# Patient Record
Sex: Male | Born: 1955 | Race: Black or African American | Hispanic: No | Marital: Single | Smoking: Former smoker
Health system: Southern US, Community
[De-identification: ages and names within clinical notes are randomized; demographics above are authoritative.]

## PROBLEM LIST (undated history)

## (undated) DIAGNOSIS — C801 Malignant (primary) neoplasm, unspecified: Secondary | ICD-10-CM

## (undated) DIAGNOSIS — C61 Malignant neoplasm of prostate: Secondary | ICD-10-CM

## (undated) DIAGNOSIS — M1990 Unspecified osteoarthritis, unspecified site: Secondary | ICD-10-CM

## (undated) DIAGNOSIS — N529 Male erectile dysfunction, unspecified: Secondary | ICD-10-CM

## (undated) DIAGNOSIS — H269 Unspecified cataract: Secondary | ICD-10-CM

## (undated) HISTORY — PX: PROSTATE SURGERY: SHX751

## (undated) HISTORY — DX: Unspecified cataract: H26.9

## (undated) HISTORY — DX: Male erectile dysfunction, unspecified: N52.9

## (undated) HISTORY — DX: Unspecified osteoarthritis, unspecified site: M19.90

---
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
## 1977-11-19 HISTORY — PX: OTHER SURGICAL HISTORY: SHX169

## 2006-11-19 HISTORY — PX: COLONOSCOPY: SHX174

## 2007-03-06 ENCOUNTER — Emergency Department (HOSPITAL_COMMUNITY): Admission: EM | Admit: 2007-03-06 | Discharge: 2007-03-06 | Payer: Self-pay | Admitting: Family Medicine

## 2007-09-03 ENCOUNTER — Ambulatory Visit: Payer: Self-pay | Admitting: Gastroenterology

## 2007-09-17 ENCOUNTER — Ambulatory Visit: Payer: Self-pay | Admitting: Gastroenterology

## 2007-11-06 ENCOUNTER — Emergency Department (HOSPITAL_COMMUNITY): Admission: EM | Admit: 2007-11-06 | Discharge: 2007-11-06 | Payer: Self-pay | Admitting: Emergency Medicine

## 2007-11-10 ENCOUNTER — Ambulatory Visit (HOSPITAL_COMMUNITY): Admission: RE | Admit: 2007-11-10 | Discharge: 2007-11-10 | Payer: Self-pay | Admitting: Urology

## 2008-11-19 DIAGNOSIS — C61 Malignant neoplasm of prostate: Secondary | ICD-10-CM

## 2008-11-19 HISTORY — DX: Malignant neoplasm of prostate: C61

## 2009-03-17 ENCOUNTER — Encounter (INDEPENDENT_AMBULATORY_CARE_PROVIDER_SITE_OTHER): Payer: Self-pay | Admitting: Urology

## 2009-03-17 ENCOUNTER — Inpatient Hospital Stay (HOSPITAL_COMMUNITY): Admission: RE | Admit: 2009-03-17 | Discharge: 2009-03-18 | Payer: Self-pay | Admitting: Urology

## 2009-12-09 ENCOUNTER — Ambulatory Visit: Payer: Self-pay | Admitting: Internal Medicine

## 2009-12-09 ENCOUNTER — Encounter (INDEPENDENT_AMBULATORY_CARE_PROVIDER_SITE_OTHER): Payer: Self-pay | Admitting: Family Medicine

## 2009-12-09 LAB — CONVERTED CEMR LAB
Albumin: 4.7 g/dL (ref 3.5–5.2)
Alkaline Phosphatase: 94 units/L (ref 39–117)
Basophils Relative: 0 % (ref 0–1)
CO2: 25 meq/L (ref 19–32)
Chloride: 106 meq/L (ref 96–112)
Cholesterol: 194 mg/dL (ref 0–200)
Creatinine, Ser: 1.01 mg/dL (ref 0.40–1.50)
Glucose, Bld: 89 mg/dL (ref 70–99)
HCT: 42 % (ref 39.0–52.0)
HDL: 61 mg/dL (ref >39)
Hemoglobin: 14.2 g/dL (ref 13.0–17.0)
Lymphs Abs: 2.6 10*3/uL (ref 0.7–4.0)
MCV: 95 fL (ref 78.0–100.0)
Monocytes Absolute: 0.5 10*3/uL (ref 0.1–1.0)
Monocytes Relative: 8 % (ref 3–12)
Neutro Abs: 3.7 10*3/uL (ref 1.7–7.7)
RDW: 13.1 % (ref 11.5–15.5)
Total CHOL/HDL Ratio: 3.2

## 2010-02-23 ENCOUNTER — Ambulatory Visit: Payer: Self-pay | Admitting: Internal Medicine

## 2010-04-26 ENCOUNTER — Ambulatory Visit: Payer: Self-pay | Admitting: Internal Medicine

## 2010-06-14 ENCOUNTER — Ambulatory Visit: Payer: Self-pay | Admitting: Internal Medicine

## 2010-12-10 ENCOUNTER — Encounter: Payer: Self-pay | Admitting: Urology

## 2011-02-28 LAB — BASIC METABOLIC PANEL
BUN: 8 mg/dL (ref 6–23)
CO2: 28 mEq/L (ref 19–32)
Calcium: 9.3 mg/dL (ref 8.4–10.5)
GFR calc non Af Amer: >60 mL/min (ref >60)
Glucose, Bld: 107 mg/dL — ABNORMAL HIGH (ref 70–99)
Potassium: 4.3 mEq/L (ref 3.5–5.1)

## 2011-02-28 LAB — CBC
HCT: 39.7 % (ref 39.0–52.0)
RBC: 4.01 MIL/uL — ABNORMAL LOW (ref 4.22–5.81)
WBC: 4.7 10*3/uL (ref 4.0–10.5)

## 2011-02-28 LAB — HEMOGLOBIN AND HEMATOCRIT, BLOOD
HCT: 36.5 % — ABNORMAL LOW (ref 39.0–52.0)
Hemoglobin: 12.4 g/dL — ABNORMAL LOW (ref 13.0–17.0)

## 2011-02-28 LAB — TYPE AND SCREEN: Antibody Screen: NEGATIVE

## 2011-04-03 NOTE — Op Note (Signed)
Joshua Velazquez, Joshua Velazquez NO.:  [id number]   MEDICAL RECORD NO.:  [id number]          PATIENT TYPE:  INP   LOCATION:  0006                         FACILITY:  [facility]   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   PROCEDURE:  Robotic-assisted laparoscopic radical prostatectomy  (bilateral nerve sparing).   SURGEON:  Heloise Purpura, M.D.   FIRST ASSISTANT:  Delia Chimes, Nurse Practitioner.   SECOND ASSISTANT:  Georgeanna Lea, M.D.   ANESTHESIA:  General.   COMPLICATIONS:  None.   ESTIMATED BLOOD LOSS:  100 mL.   INTRAVENOUS FLUIDS:  1300 mL of lactated Ringer's.   SPECIMEN:  Prostate and seminal vesicles.   DISPOSITION:  Specimen to pathology.   DRAINS:  1. A 20-French coude' catheter.  2. A #19 Blake pelvic drain.   INDICATIONS FOR PROCEDURE:  Joshua Velazquez is a 55 year old gentleman with  clinically-localized adenocarcinoma of the prostate.  After a discussion  regarding the management options for treatment, he elected to proceed  with surgical therapy and the above procedure.  The potential risks,  complications and alternative treatment options were discussed in  detail, and an informed consent was obtained.   DESCRIPTION OF PROCEDURE:  The patient was taken to the operating room  and a general anesthetic was administered.  He was given preoperative  antibiotics, placed in the dorsal lithotomy position and prepped and  draped in the usual sterile fashion.  Next, a preoperative time-out was  performed.  A Foley catheter was then inserted into the bladder and a  site was selected just to the left of the umbilicus for  placement of the  camera port.  This was placed using a standard open Hassan technique  which allowed entry into the peritoneal cavity under direct vision  without difficulty.  A 12 mm port was then placed and a pneumoperitoneum  established.  With the 0-degree lens, the abdomen was inspected and  there was no evidence for any intra-abdominal injuries.  There were  noted to be adhesions in the right upper quadrant consistent with the  patient's prior exploratory surgery, related to a past trauma event;  however, these adhesions did not appear to be in the way of the expected  port placements.  The 8 mm robotic ports were placed in the left lower  quadrant and right lower quadrant.  A 5 mm port was placed between the  camera port and the right robotic port and a 12 mm port was placed in  the far right lateral abdominal wall for laparoscopic assistance.  All  ports were placed under direct vision without difficulty.  The surgical  cart was then docked.  With the aid of the cautery scissors, the bladder  was reflected posteriorly, allowing entry into space of Retzius  and  identification of the endopelvic fascia and prostate.  The endopelvic  fascia was then incised, from the apex back to the base of the prostate  bilaterally and the underlying levator muscle fibers were swept  laterally off of the prostate.  This isolated the dorsal venous complex  which was then stapled and divided with a 45 mm flex ETS stapler.   The attention was then turned to the bladder neck which was divided  anteriorly.  This exposed the Foley catheter and the catheter balloon  was deflated.  The catheter was brought into the operative field and  used to retract the prostate anteriorly.  This exposed the posterior  bladder neck which was then divided and dissection proceeded between the  prostate and bladder until the vasa deferens and seminal vesicles were  identified.  The vasa deferens were isolated, divided and  lifted  anteriorly.  The seminal vesicles were then dissected down to their tips  with the use of Hemolock clips to control seminal vesicle arterial blood  supply.  The seminal vesicles were then lifted anteriorly and the space  between Denonvilliers' fascia and the anterior rectum was bluntly  developed, thereby isolating the vascular pedicles of the prostate.  The  lateral prostatic fascia was then incised bilaterally, allowing the  neurovascular bundles to be released.  The vascular pedicles of the  prostate were then ligated with Hemolock clips above the level of the  neurovascular bundles and divided with sharp cold scissor dissection.  The neurovascular bundles were then swept off the apex of the prostate  and urethra.  The ureter was then sharply transected, allowing the  prostate specimen to be disarticulated.  The pelvis was copiously  irrigated and hemostasis was ensured.  There was noted to be a small  area of venous bleeding from the right vascular pedicle, which was  controlled with an additional Hemolock clip.   Attention then turned to the urethral anastomosis.  A 2-0 Vicryl slip-  knot was placed between Denonvilliers' fascia, the posterior bladder  neck and the posterior urethra, to reapproximate these structures.  A  double-armed 3-0 Monocryl suture was then used to perform a 360-degree  running tension-free anastomosis between the bladder neck and urethra.  A new 20-French coude' catheter was inserted into the bladder and  irrigated.  There were no blood clots within the bladder, and the  anastomosis appeared to be water-tight.  A #19 Blake drain was then  brought through the left robotic port and appropriately positioned  within the pelvis.  It was secured to the skin with a nylon suture.  The  surgical cart was then undocked.  The right lateral 12 mm port site was  closed with 0-Vicryl suture, placed with the aid of the suture passer  device.  All remaining ports  were removed under direct vision.  The  prostate specimen was removed intact within the Endopouch retrieval bag.  This fascial opening was then closed with a running 0-Vicryl suture.  All port sites were injected with 0.25% Marcaine and reapproximated at  the skin level with staples.  Sterile dressings were applied.   The patient appeared to tolerate procedure well without complications.  He was able to be extubated and transferred to the recovery unit in  satisfactory condition  .     Heloise Purpura, MD  Electronically Signed    LB/MEDQ  D:  03/17/2009  T:  03/17/2009  Job:  [id number]

## 2011-05-19 ENCOUNTER — Emergency Department (HOSPITAL_COMMUNITY)
Admission: EM | Admit: 2011-05-19 | Discharge: 2011-05-19 | Disposition: A | Discharge status: Home / Self Care | Payer: Self-pay | Attending: Emergency Medicine | Admitting: Emergency Medicine

## 2011-05-19 DIAGNOSIS — Z8546 Personal history of malignant neoplasm of prostate: Secondary | ICD-10-CM | POA: Insufficient documentation

## 2011-05-19 DIAGNOSIS — K6289 Other specified diseases of anus and rectum: Secondary | ICD-10-CM | POA: Insufficient documentation

## 2011-05-19 DIAGNOSIS — R197 Diarrhea, unspecified: Secondary | ICD-10-CM | POA: Insufficient documentation

## 2011-08-24 LAB — I-STAT 8, (EC8 V) (CONVERTED LAB)
Bicarbonate: 26.9 — ABNORMAL HIGH
Glucose, Bld: 120 — ABNORMAL HIGH
HCT: 47
Hemoglobin: 16
Operator id: 247071
pCO2, Ven: 35.6 — ABNORMAL LOW
pH, Ven: 7.486 — ABNORMAL HIGH

## 2011-08-24 LAB — DIFFERENTIAL
Basophils Absolute: 0
Eosinophils Relative: 1
Neutro Abs: 4.5

## 2011-08-24 LAB — CBC
RBC: 4.33
RDW: 14

## 2011-08-24 LAB — POCT I-STAT CREATININE: Creatinine, Ser: 1.1

## 2012-03-07 ENCOUNTER — Emergency Department (HOSPITAL_COMMUNITY): Payer: Self-pay

## 2012-03-07 ENCOUNTER — Encounter (HOSPITAL_COMMUNITY): Payer: Self-pay | Admitting: General Practice

## 2012-03-07 ENCOUNTER — Emergency Department (HOSPITAL_COMMUNITY)
Admission: EM | Admit: 2012-03-07 | Discharge: 2012-03-07 | Disposition: A | Discharge status: Home / Self Care | Payer: Self-pay | Attending: Emergency Medicine | Admitting: Emergency Medicine

## 2012-03-07 DIAGNOSIS — M25579 Pain in unspecified ankle and joints of unspecified foot: Secondary | ICD-10-CM | POA: Insufficient documentation

## 2012-03-07 DIAGNOSIS — M79606 Pain in leg, unspecified: Secondary | ICD-10-CM

## 2012-03-07 DIAGNOSIS — F17200 Nicotine dependence, unspecified, uncomplicated: Secondary | ICD-10-CM | POA: Insufficient documentation

## 2012-03-07 DIAGNOSIS — M79609 Pain in unspecified limb: Secondary | ICD-10-CM | POA: Insufficient documentation

## 2012-03-07 HISTORY — DX: Malignant (primary) neoplasm, unspecified: C80.1

## 2012-03-07 MED ORDER — NAPROXEN 250 MG PO TABS: 250.0000 mg | ORAL_TABLET | Freq: Two times a day (BID) | ORAL | Status: AC

## 2012-03-07 NOTE — Discharge Instructions (Signed)
RESOURCE GUIDE  Dental Problems  Patients with Medicaid: [facility]                     [facility] [address]                                           [address] Phone:  [phone]                                                  Phone:  [phone]  If unable to pay or uninsured, contact:  [facility] or [facility] to become qualified for the adult dental clinic.  Chronic Pain Problems Contact [facility]  [phone] Patients need to be referred by their primary care doctor.  Insufficient Money for Medicine Contact [employer]:  call "211" or [facility] 271-5999.  No Primary Care Doctor Call [facility]  832-8000 Other agencies that provide inexpensive medical care    [facility]  [phone]    [facility]  [phone]    [facility]  [phone]    Women's Clinic  [phone]    [facility]  [phone]    [facility]  [phone]  Psychological Services [facility]  [phone] [facility]  [phone] [facility]   [phone] (emergency services 641-4993)  Substance Abuse Resources [facility]  336-882-2125 [facility] 336-784-9470 [facility] 336-285-9073 [facility] 336-845-3988 Residential & Outpatient Substance Abuse Program  800-659-3381  Abuse/Neglect [place] Child Abuse Hotline (336) 641-3795 [place] Child Abuse Hotline 800-378-5315 (After Hours)  Emergency Shelter [facility] (336) 271-5985  Maternity Homes [facility] (336) 275-9566 [facility] (704) 372-4663  MRSA Hotline #:   832-7006    [place] Resources  [facility] [place]     [employer]                          [facility] [address] [place]                       [address]      [address]  [place]                                                [place]                            [place] Phone:  [phone]                                   Phone:  [phone]                 Phone:  [phone]  [facility] Phone:  [phone]    [facility] Child Abuse Hotline 940 684 7062 470-062-1113 (After Hours)   Take the prescription as directed.  Apply moist heat or ice to the area(s) of discomfort, for 15 minutes at a time, several times per day for the next few days.  Do not fall asleep on a heating or ice pack.  Call your regular medical doctor and the Orthopedic doctor today to schedule a follow up appointment within the next week.  Return to the Emergency Department immediately if worsening.

## 2012-03-07 NOTE — ED Provider Notes (Signed)
History     CSN: [id number]  Arrival date & time 03/07/12  1111   First MD Initiated Contact with Patient 03/07/12 1144      Chief Complaint  Patient presents with  . Leg Pain    HPI Pt was seen at 1150.  Per pt, c/o gradual onset and persistence of constant left anterior tibial and ankle "pain" that began approx 1 year ago.  Pt states his symptoms started after he "sprained my ankle."  Pt states he has been eval by his PMD and Ortho MD for same but does not recall the dx.  Pt states the pain worsens after activity such as running and palpation of the area.  Denies foot pain, no knee pain, no calf pain/swelling, no rash, no fevers, no focal motor weakness, no tingling/numbness in extremity.    Past Medical History  Diagnosis Date  . Cancer     Prostate    Past Surgical History  Procedure Date  . Prostate surgery     Removal of prostate    History  Substance Use Topics  . Smoking status: Current Everyday Smoker -- 0.5 packs/day    Types: Cigarettes  . Smokeless tobacco: Never Used  . Alcohol Use: No    Review of Systems ROS: Statement: All systems negative except as marked or noted in the HPI; Constitutional: Negative for fever and chills. ; ; Eyes: Negative for eye pain, redness and discharge. ; ; ENMT: Negative for ear pain, hoarseness, nasal congestion, sinus pressure and sore throat. ; ; Cardiovascular: Negative for chest pain, palpitations, diaphoresis, dyspnea and peripheral edema. ; ; Respiratory: Negative for cough, wheezing and stridor. ; ; Gastrointestinal: Negative for nausea, vomiting, diarrhea, abdominal pain, blood in stool, hematemesis, jaundice and rectal bleeding. . ; ; Genitourinary: Negative for dysuria, flank pain and hematuria. ; ; Musculoskeletal: +left tibia pain.  Negative for back pain and neck pain. Negative for swelling and trauma.; ; Skin: Negative for pruritus, rash, abrasions, blisters, bruising and skin lesion.; ; Neuro: Negative for headache,  lightheadedness and neck stiffness. Negative for weakness, altered level of consciousness , altered mental status, extremity weakness, paresthesias, involuntary movement, seizure and syncope.     Allergies  Review of patient's allergies indicates no known allergies.  Home Medications   Current Outpatient Rx  Name Route Sig Dispense Refill  . IBUPROFEN 800 MG PO TABS Oral Take 800 mg by mouth every 8 (eight) hours as needed. For pain.      BP 132/85  Pulse 88  Temp(Src) 98 F (36.7 C) (Oral)  Resp 18  Physical Exam 1155: Physical examination:  Nursing notes reviewed; Vital signs and O2 SAT reviewed;  Constitutional: Well developed, Well nourished, Well hydrated, In no acute distress; Head:  Normocephalic, atraumatic; Eyes: EOMI, PERRL, No scleral icterus; ENMT: Mouth and pharynx normal, Mucous membranes moist; Neck: Supple, Full range of motion, No lymphadenopathy; Cardiovascular: Regular rate and rhythm, No murmur, rub, or gallop; Respiratory: Breath sounds clear & equal bilaterally, No rales, rhonchi, wheezes, or rub, Normal respiratory effort/excursion; Chest: Nontender, Movement normal; Abdomen: Soft, Nontender, Nondistended, Normal bowel sounds; Genitourinary: No CVA tenderness; Extremities: Pulses normal, No tenderness, NT left ankle, no edema, no ecchymosis, no erythema, no open wounds, no deformity. NMS intact left foot, strong pedal pp, LLE muscle compartments soft.  No left proximal fibular head tenderness, no left knee tenderness, no left foot tenderness.  +plantarflexion of left foot w/calf squeeze.  No palpable gap left Achilles's tendon. NT left knee with  FROM, including able to lift extended LLE off stretcher, and extend left lower leg against resistance.  No ligamentous laxity.  No patellar or quad tendon step-offs.  No edema, erythema, warmth, deformity.  Mild tenderness to left mid-anterior tibia bone area without open wound, no erythema, no ecchymosis, no edema, no deformity.   Left calf NT.  No edema, No calf edema or asymmetry.; Neuro: AA&Ox3, Major CN grossly intact.  No gross focal motor or sensory deficits in extremities.; Skin: Color normal, Warm, Dry, no rash.    ED Course  Procedures    MDM  MDM Reviewed: nursing note and vitals Interpretation: x-ray   Dg Tibia/fibula Left 03/07/2012  *RADIOLOGY REPORT*  Clinical Data: Leg pain  LEFT TIBIA AND FIBULA - 2 VIEW  Comparison: None  Findings: Four views of the left tibia-fibula submitted.  No acute fracture or subluxation.  No radiopaque foreign body.  IMPRESSION: No acute fracture or subluxation.  Original Report Authenticated By: Natasha Mead, M.D.   Dg Ankle Complete Left 03/07/2012  *RADIOLOGY REPORT*  Clinical Data: Pain  LEFT ANKLE COMPLETE - 3+ VIEW  Comparison: None.  Findings: Three views of the left ankle submitted.  No acute fracture or subluxation.  Ankle mortise is preserved.  IMPRESSION: No acute fracture or subluxation.  Original Report Authenticated By: Natasha Mead, M.D.      12:47 PM:   No acute bony injury on XR.  Doubt DVT as calf is NT.  Likely "shin splints" as pt c/o pain after activity, esp running.  Doubt arterial insuff given strong palp pedal pulses.  Dx testing d/w pt and family.  Questions answered.  Verb understanding, agreeable to d/c home with outpt f/u with Ortho MD.       Laray Anger, DO 03/10/12 9604

## 2012-03-07 NOTE — ED Notes (Signed)
Pt d/c home in NAD. Pt voiced understanding of d/c instructions and follow up care.  

## 2012-03-07 NOTE — ED Notes (Signed)
Pt states "I sprained my whole leg a year ago and still having pain and swelling." Pain is in the left shin. No swelling noted to area.

## 2012-03-07 NOTE — ED Notes (Signed)
Pt states he sprained his ankle approximately 1 year ago and has had recurrent left leg pain since that time . His pain is localized to left anterior shin with radiation to ankle, increased on palpation and weight bearing.

## 2012-05-06 IMAGING — CR DG TIBIA/FIBULA 2V*L*
4 series · 4 of 4 positions shown · non-contrast
Comparison: None

CLINICAL DATA: Leg pain

LEFT TIBIA AND FIBULA - 2 VIEW

[t tib/fib ap left (1 of 2)]
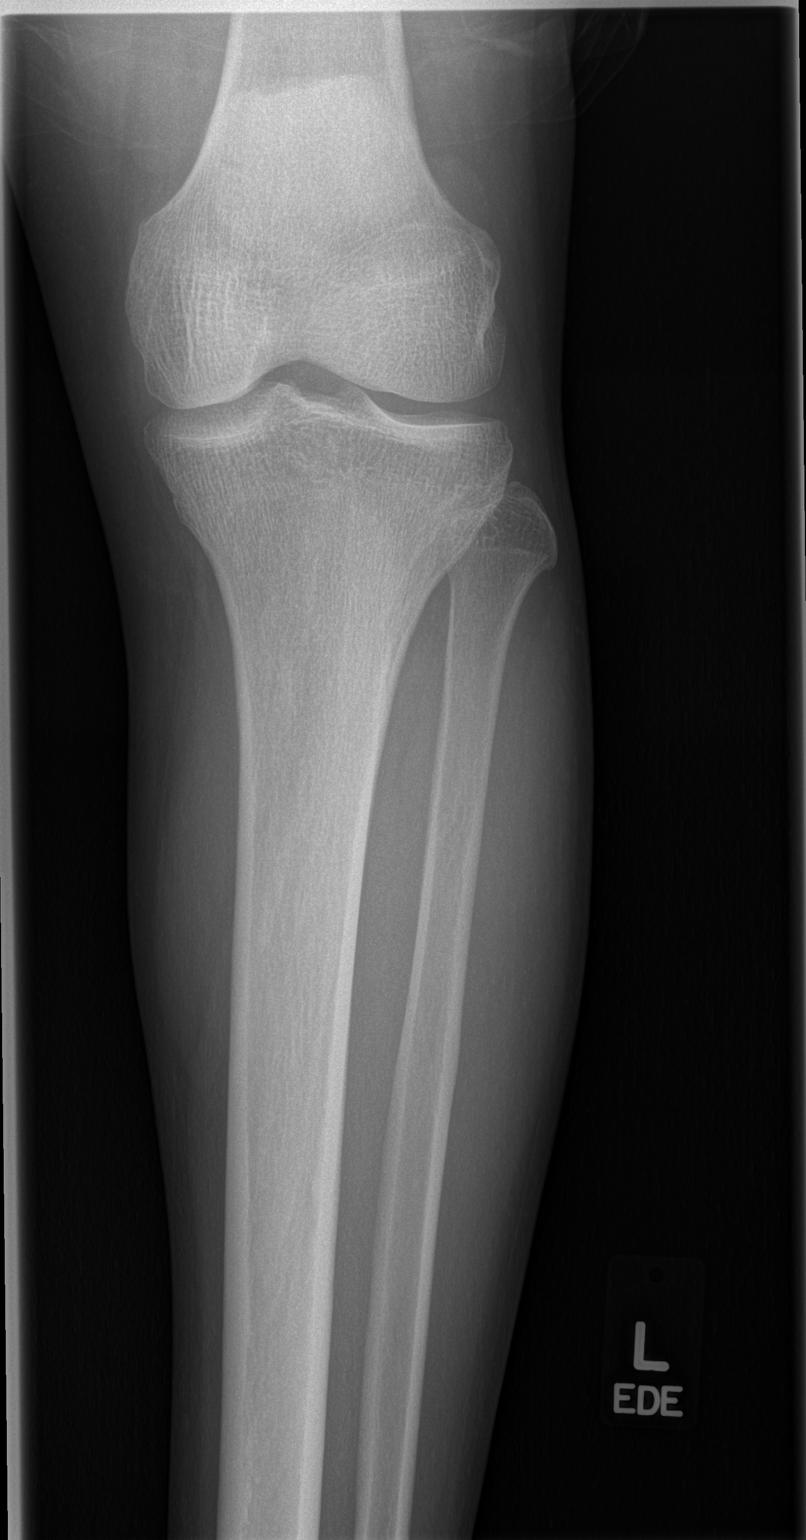

[t tib/fib ap left (2 of 2)]
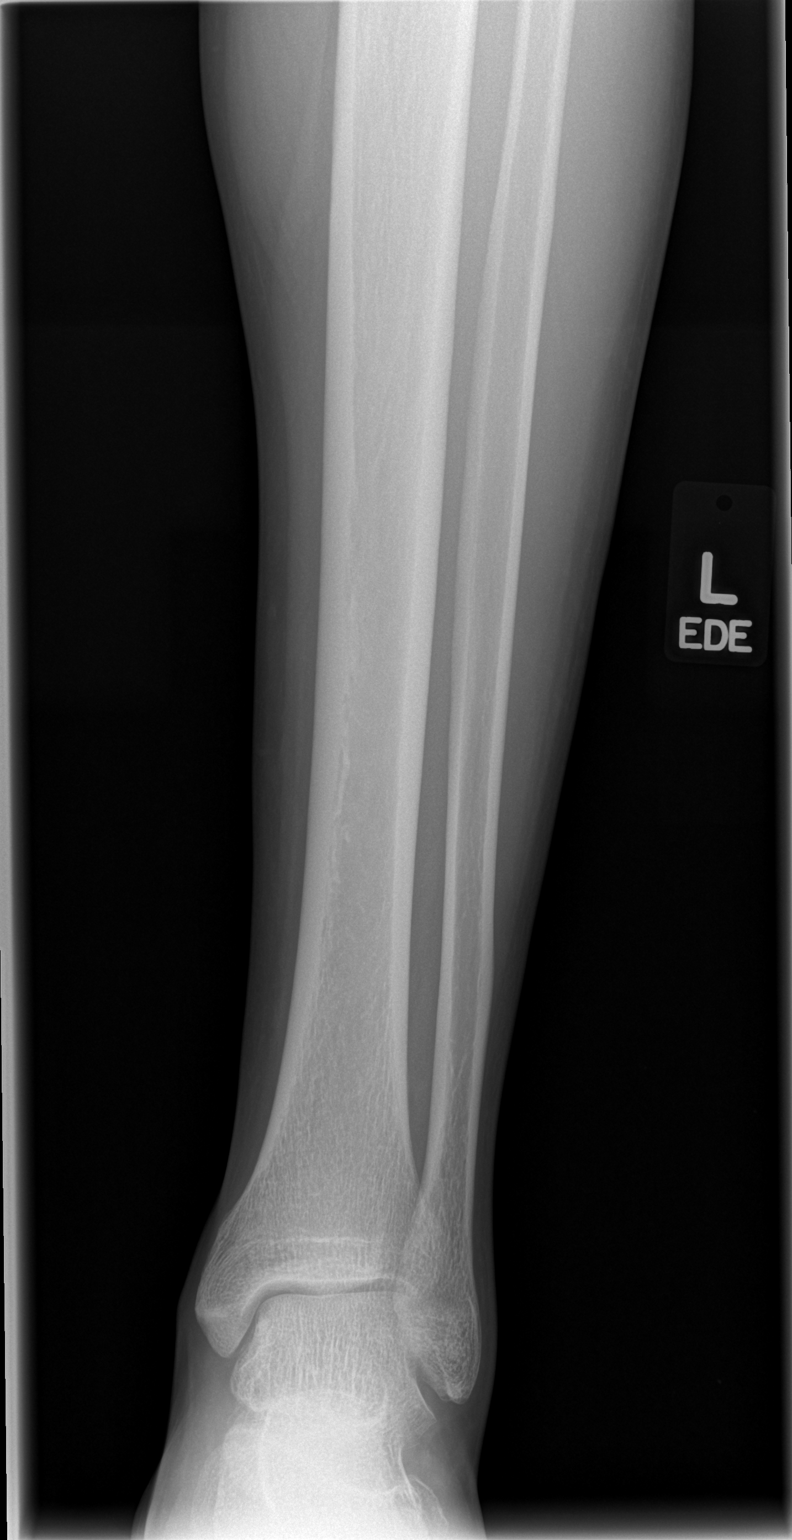

[t tib/fib lat left (1 of 2)]
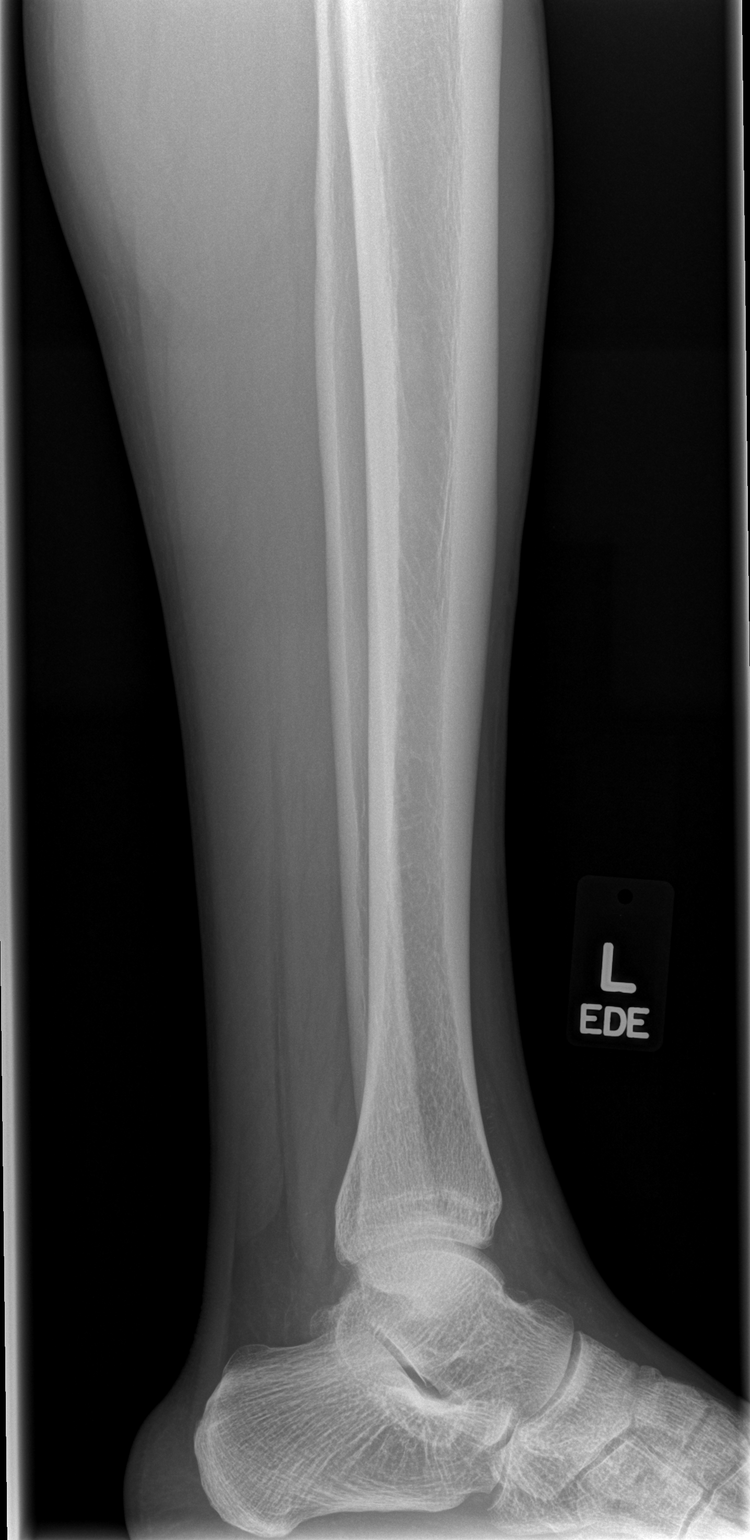

[t tib/fib lat left (2 of 2)]
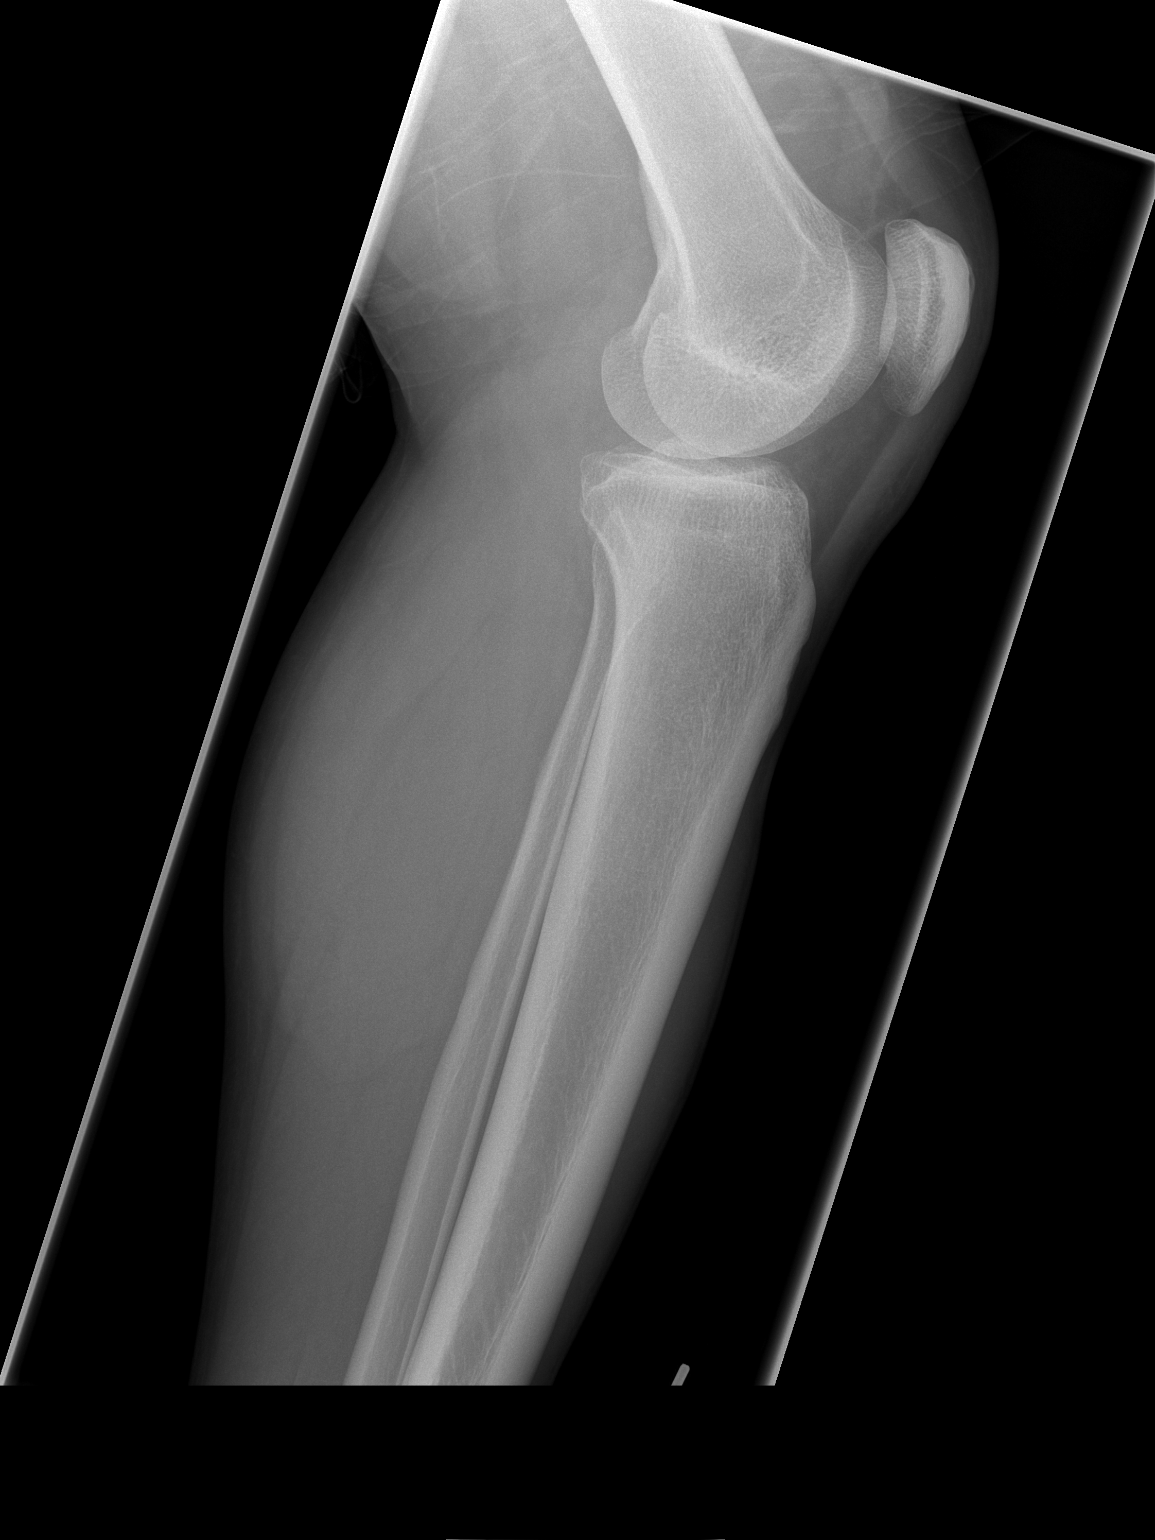

[4 of 4 positions shown; findings below may reference images not displayed]

FINDINGS: Four views of the left tibia-fibula submitted.  No acute
fracture or subluxation.  No radiopaque foreign body.
IMPRESSION: No acute fracture or subluxation.

## 2012-07-30 ENCOUNTER — Encounter: Payer: Self-pay | Admitting: Family Medicine

## 2013-12-13 ENCOUNTER — Encounter (HOSPITAL_COMMUNITY): Payer: Self-pay | Admitting: Emergency Medicine

## 2013-12-13 ENCOUNTER — Emergency Department (HOSPITAL_COMMUNITY)
Admission: EM | Admit: 2013-12-13 | Discharge: 2013-12-13 | Disposition: A | Discharge status: Home / Self Care | Payer: Self-pay | Attending: Emergency Medicine | Admitting: Emergency Medicine

## 2013-12-13 DIAGNOSIS — R5381 Other malaise: Secondary | ICD-10-CM | POA: Insufficient documentation

## 2013-12-13 DIAGNOSIS — R5383 Other fatigue: Secondary | ICD-10-CM

## 2013-12-13 DIAGNOSIS — R51 Headache: Secondary | ICD-10-CM | POA: Insufficient documentation

## 2013-12-13 DIAGNOSIS — J029 Acute pharyngitis, unspecified: Secondary | ICD-10-CM | POA: Insufficient documentation

## 2013-12-13 DIAGNOSIS — J209 Acute bronchitis, unspecified: Secondary | ICD-10-CM | POA: Insufficient documentation

## 2013-12-13 DIAGNOSIS — Z8546 Personal history of malignant neoplasm of prostate: Secondary | ICD-10-CM | POA: Insufficient documentation

## 2013-12-13 DIAGNOSIS — R52 Pain, unspecified: Secondary | ICD-10-CM | POA: Insufficient documentation

## 2013-12-13 DIAGNOSIS — J40 Bronchitis, not specified as acute or chronic: Secondary | ICD-10-CM

## 2013-12-13 DIAGNOSIS — IMO0001 Reserved for inherently not codable concepts without codable children: Secondary | ICD-10-CM | POA: Insufficient documentation

## 2013-12-13 DIAGNOSIS — F17200 Nicotine dependence, unspecified, uncomplicated: Secondary | ICD-10-CM | POA: Insufficient documentation

## 2013-12-13 MED ORDER — ALBUTEROL SULFATE HFA 108 (90 BASE) MCG/ACT IN AERS: 2.0000 | INHALATION_SPRAY | RESPIRATORY_TRACT | Status: DC | PRN

## 2013-12-13 MED ORDER — HYDROCOD POLST-CHLORPHEN POLST 10-8 MG/5ML PO LQCR: 5.0000 mL | Freq: Two times a day (BID) | ORAL | Status: DC | PRN

## 2013-12-13 MED ORDER — AZITHROMYCIN 250 MG PO TABS: 250.0000 mg | ORAL_TABLET | Freq: Every day | ORAL | Status: DC

## 2013-12-13 MED ORDER — ALBUTEROL SULFATE HFA 108 (90 BASE) MCG/ACT IN AERS
2.0000 | INHALATION_SPRAY | Freq: Once | RESPIRATORY_TRACT | Status: AC
  Administered 2013-12-13: 2 via RESPIRATORY_TRACT
  Filled 2013-12-13: qty 6.7

## 2013-12-13 NOTE — ED Provider Notes (Signed)
Medical screening examination/treatment/procedure(s) were performed by non-physician practitioner and as supervising physician I was immediately available for consultation/collaboration.  EKG Interpretation   None        Threasa Beards, MD 12/13/13 2112

## 2013-12-13 NOTE — ED Provider Notes (Signed)
CSN: [id number]     Arrival date & time 12/13/13  1932 History  This chart was scribed for non-physician practitioner, Ignacia Felling, PA-C,working with Threasa Beards, MD, by Marlowe Kays, ED Scribe.  This patient was seen in room TR06C/TR06C and the patient's care was started at 8:43 PM.  Chief Complaint  Patient presents with  . Cough   The history is provided by the patient. No language interpreter was used.   HPI Comments:  Deagen Krass is a 58 y.o. male who presents to the Emergency Department complaining of worsening productive cough of green phlegm, body aches, and headache that started approximately two weeks ago. Pt reports associated chills, subjective fever, and sore throat. Pt reports taking OTC medications with no relief. Pt denies sick contacts. Pt denies otalgia. Pt reports being a smoker. Pt states he has never had a flu vaccination.    Past Medical History  Diagnosis Date  . Cancer     Prostate   Past Surgical History  Procedure Laterality Date  . Prostate surgery      Removal of prostate   No family history on file. History  Substance Use Topics  . Smoking status: Current Every Day Smoker -- 0.50 packs/day    Types: Cigarettes  . Smokeless tobacco: Never Used  . Alcohol Use: No    Review of Systems  Constitutional: Positive for chills. Negative for fever.       Generalized body aches and fatigue  HENT: Positive for sore throat. Negative for ear pain.   Respiratory: Positive for cough.   Gastrointestinal: Negative for nausea, vomiting and diarrhea.  Neurological: Positive for headaches.  All other systems reviewed and are negative.    Allergies  Review of patient's allergies indicates no known allergies.  Home Medications   Current Outpatient Rx  Name  Route  Sig  Dispense  Refill  . ibuprofen (ADVIL,MOTRIN) 800 MG tablet   Oral   Take 800 mg by mouth every 8 (eight) hours as needed. For pain.          Triage Vitals: BP 131/80  Pulse  100  Temp(Src) 98.5 F (36.9 C) (Oral)  Resp 20  Wt 194 lb 3 oz (88.083 kg)  SpO2 95% Physical Exam  Nursing note and vitals reviewed. Constitutional: He is oriented to person, place, and time. He appears well-developed and well-nourished. No distress.  HENT:  Head: Normocephalic and atraumatic.  Right Ear: External ear normal.  Left Ear: External ear normal.  Mouth/Throat: Oropharynx is clear and moist. No oropharyngeal exudate.  Clear rhinorrhea  Eyes: Conjunctivae are normal. Pupils are equal, round, and reactive to light. No scleral icterus.  Neck: Normal range of motion. Neck supple.  Cardiovascular: Normal rate, regular rhythm and normal heart sounds.  Exam reveals no gallop and no friction rub.   No murmur heard. Pulmonary/Chest: Effort normal. No respiratory distress. He has wheezes. He has no rales. He exhibits no tenderness.  Bilateral expiratory wheezing  Abdominal: Soft. Bowel sounds are normal. He exhibits no distension. There is no tenderness. There is no rebound.  Musculoskeletal: Normal range of motion. He exhibits no edema and no tenderness.  Lymphadenopathy:    He has no cervical adenopathy.  Neurological: He is alert and oriented to person, place, and time. He exhibits normal muscle tone. Coordination normal.  Skin: Skin is warm and dry. No rash noted. No erythema. No pallor.  Psychiatric: He has a normal mood and affect. His behavior is normal. Judgment and thought  content normal.    ED Course  Procedures (including critical care time) DIAGNOSTIC STUDIES: Oxygen Saturation is 95% on RA, adequate by my interpretation.   COORDINATION OF CARE: 8:50PM- Will prescribe antibiotic, albuterol MDI and cough medication. Pt verbalizes understanding and agrees to plan.  Medications - No data to display  Labs Review Labs Reviewed - No data to display Imaging Review No results found.  EKG Interpretation   None       MDM  Bronchitis  Patient here with cough,  nasal congestion, wheezing, headache, body aches.  Afebrile here so I doubt influenza, patient is smoker so I think this is more bronchitis than anything.  Will start on antibiotics, albuterol inhaler here, and cough medication.  I personally performed the services described in this documentation, which was scribed in my presence. The recorded information has been reviewed and is accurate.    Idalia Needle Joelyn Oms, [place] 12/13/13 2056

## 2013-12-13 NOTE — Discharge Instructions (Signed)

## 2013-12-13 NOTE — ED Notes (Signed)
C/o productive cough with green phlegm, body aches, headache, chills, and fatigue intermittently x 2 weeks.  Taking OTC meds without relief.  Denies nausea, vomiting, and diarrhea.

## 2015-02-22 ENCOUNTER — Emergency Department (INDEPENDENT_AMBULATORY_CARE_PROVIDER_SITE_OTHER)
Admission: EM | Admit: 2015-02-22 | Discharge: 2015-02-22 | Disposition: A | Discharge status: Home / Self Care | Payer: Self-pay | Source: Home / Self Care | Attending: Family Medicine | Admitting: Family Medicine

## 2015-02-22 ENCOUNTER — Encounter (HOSPITAL_COMMUNITY): Payer: Self-pay | Admitting: Emergency Medicine

## 2015-02-22 DIAGNOSIS — K5909 Other constipation: Secondary | ICD-10-CM

## 2015-02-22 DIAGNOSIS — K594 Anal spasm: Secondary | ICD-10-CM

## 2015-02-22 MED ORDER — GLYCERIN (LAXATIVE) 2 G RE SUPP: 1.0000 | Freq: Once | RECTAL | Status: DC

## 2015-02-22 MED ORDER — DILTIAZEM GEL 2 %: 1.0000 "application " | Freq: Two times a day (BID) | CUTANEOUS | Status: DC

## 2015-02-22 NOTE — ED Provider Notes (Signed)
CSN: [id number]     Arrival date & time 02/22/15  1133 History   First MD Initiated Contact with Patient 02/22/15 1321     Chief Complaint  Patient presents with  . Rectal Bleeding    no bleeding.  having anal pain.    (Consider location/radiation/quality/duration/timing/severity/associated sxs/prior Treatment) HPI  Yesterday evening developed severe rectal pain. Started after BM. Difficult to pass. Epsom salt soak and suppositories w/o benefit. Worse w/ sitting. Denies diarrhea, fevers, abd pain, dysuria, back pain, HA, CP, SOB, BRBPR, melena. No anal sex. Tylenol w/o improvement.    Past Medical History  Diagnosis Date  . Cancer     Prostate   Past Surgical History  Procedure Laterality Date  . Prostate surgery      Removal of prostate   Family History  Problem Relation Age of Onset  . Cancer Neg Hx    History  Substance Use Topics  . Smoking status: Current Every Day Smoker -- 0.50 packs/day    Types: Cigarettes  . Smokeless tobacco: Never Used  . Alcohol Use: No    Review of Systems Per HPI with all other pertinent systems negative.   Allergies  Review of patient's allergies indicates no known allergies.  Home Medications   Prior to Admission medications   Medication Sig Start Date End Date Taking? Authorizing Provider  diltiazem 2 % GEL Apply 1 application topically 2 (two) times daily. 02/22/15   Waldemar Dickens, MD  glycerin adult (GLYCERIN ADULT) 2 G SUPP Place 1 suppository rectally once. 02/22/15   Waldemar Dickens, MD   BP 143/90 mmHg  Pulse 80  Temp(Src) 98.1 F (36.7 C) (Oral)  Resp 18  SpO2 100% Physical Exam Physical Exam  Constitutional: oriented to person, place, and time. appears well-developed and well-nourished. No distress.  HENT:  Head: Normocephalic and atraumatic.  Eyes: EOMI. PERRL.  Neck: Normal range of motion.  Cardiovascular: RRR, no m/r/g, 2+ distal pulses,  Pulmonary/Chest: Effort normal and breath sounds normal. No respiratory  distress.  Abdominal: Soft. Bowel sounds are normal. NonTTP, no distension.  Musculoskeletal: Normal range of motion. Non ttp, no effusion.  Neurological: alert and oriented to person, place, and time.  Skin: Skin is warm. No rash noted. non diaphoretic.  Psychiatric: normal mood and affect. behavior is normal. Judgment and thought content normal.  GI: no external hemorrhoids. Anal sphincter very tight, no internal hemorrhoids, masses felt. Prostate absent.   Hemoccult negative   ED Course  Procedures (including critical care time) Labs Review Labs Reviewed - No data to display  Imaging Review No results found.   MDM   1. Anal sphincter spasm   2. Other constipation    Patient likely suffered a small tear or fissure due to very large constipated bowel movement. Patient reports prostatectomy without concurrent radiation. This is performed 6 years ago so unlikely be complicating his issue but still a possibility. Patient to start on MiraLAX as well as glycerin suppositories. Patient also prescribed diltiazem 2% gel to be used to help loosen the anal sphincter. Patient got to the emergency room or follow-up with urology if problem persists.   Precautions given and all questions answered  Linna Darner, MD Family Medicine 02/22/2015, 1:50 PM     Waldemar Dickens, MD 02/22/15 1350

## 2015-02-22 NOTE — ED Notes (Signed)
Reports sudden on set of severe rectal pain and swelling yesterday.  States has had problem before but pain has never stayed constant and has not been this severe.  Denies blood in stool.  Mild constipation.   Pt has tried suppositories and sitz baths with no relief.

## 2015-02-22 NOTE — Discharge Instructions (Signed)
The cause of your anal pain is not immediately clear. You may have experienced an internal tear during a bowel movement or spasm of your anal sphincter. Please use the diltiazem gel for anal pain relief. Please also consider taking Miralax 1-2 packs twice a day and a suppository to help soften your stool. Please call your urologist and follow up if you have further issues as this may be a complication from your surgery and prostate cancer treatments.

## 2015-02-23 LAB — OCCULT BLOOD, POC DEVICE: Fecal Occult Bld: NEGATIVE

## 2016-05-27 ENCOUNTER — Emergency Department (HOSPITAL_COMMUNITY)
Admission: EM | Admit: 2016-05-27 | Discharge: 2016-05-27 | Disposition: A | Discharge status: Home / Self Care | Payer: BLUE CROSS/BLUE SHIELD | Attending: Emergency Medicine | Admitting: Emergency Medicine

## 2016-05-27 ENCOUNTER — Encounter (HOSPITAL_COMMUNITY): Payer: Self-pay | Admitting: *Deleted

## 2016-05-27 DIAGNOSIS — F17210 Nicotine dependence, cigarettes, uncomplicated: Secondary | ICD-10-CM | POA: Insufficient documentation

## 2016-05-27 DIAGNOSIS — Z8546 Personal history of malignant neoplasm of prostate: Secondary | ICD-10-CM | POA: Insufficient documentation

## 2016-05-27 DIAGNOSIS — F439 Reaction to severe stress, unspecified: Secondary | ICD-10-CM | POA: Insufficient documentation

## 2016-05-27 DIAGNOSIS — G43809 Other migraine, not intractable, without status migrainosus: Secondary | ICD-10-CM | POA: Diagnosis not present

## 2016-05-27 DIAGNOSIS — G43909 Migraine, unspecified, not intractable, without status migrainosus: Secondary | ICD-10-CM | POA: Diagnosis present

## 2016-05-27 MED ORDER — DIPHENHYDRAMINE HCL 50 MG/ML IJ SOLN
25.0000 mg | Freq: Once | INTRAMUSCULAR | Status: AC
  Administered 2016-05-27: 25 mg via INTRAMUSCULAR
  Filled 2016-05-27: qty 1

## 2016-05-27 MED ORDER — METOCLOPRAMIDE HCL 5 MG/ML IJ SOLN
10.0000 mg | Freq: Once | INTRAMUSCULAR | Status: AC
  Administered 2016-05-27: 10 mg via INTRAMUSCULAR
  Filled 2016-05-27: qty 2

## 2016-05-27 MED ORDER — ONDANSETRON 4 MG PO TBDP
4.0000 mg | ORAL_TABLET | Freq: Once | ORAL | Status: AC
  Administered 2016-05-27: 4 mg via ORAL
  Filled 2016-05-27: qty 1

## 2016-05-27 NOTE — Discharge Instructions (Signed)
Stress and Stress Management °Stress is a normal reaction to life events. It is what you feel when life demands more than you are used to or more than you can handle. Some stress can be useful. For example, the stress reaction can help you catch the last bus of the day, study for a test, or meet a deadline at work. But stress that occurs too often or for too long can cause problems. It can affect your emotional health and interfere with relationships and normal daily activities. Too much stress can weaken your immune system and increase your risk for physical illness. If you already have a medical problem, stress can make it worse. °CAUSES  °All sorts of life events may cause stress. An event that causes stress for one person may not be stressful for another person. Major life events commonly cause stress. These may be positive or negative. Examples include losing your job, moving into a new home, getting married, having a baby, or losing a loved one. Less obvious life events may also cause stress, especially if they occur day after day or in combination. Examples include working long hours, driving in traffic, caring for children, being in debt, or being in a difficult relationship. °SIGNS AND SYMPTOMS °Stress may cause emotional symptoms including, the following: °· Anxiety. This is feeling worried, afraid, on edge, overwhelmed, or out of control. °· Anger. This is feeling irritated or impatient. °· Depression. This is feeling sad, down, helpless, or guilty. °· Difficulty focusing, remembering, or making decisions. °Stress may cause physical symptoms, including the following:  °· Aches and pains. These may affect your head, neck, back, stomach, or other areas of your body. °· Tight muscles or clenched jaw. °· Low energy or trouble sleeping.  °Stress may cause unhealthy behaviors, including the following:  °· Eating to feel better (overeating) or skipping meals. °· Sleeping too little, too much, or both. °· Working  too much or putting off tasks (procrastination). °· Smoking, drinking alcohol, or using drugs to feel better. °DIAGNOSIS  °Stress is diagnosed through an assessment by your health care provider. Your health care provider will ask questions about your symptoms and any stressful life events. Your health care provider will also ask about your medical history and may order blood tests or other tests. Certain medical conditions and medicine can cause physical symptoms similar to stress.  Mental illness can cause emotional symptoms and unhealthy behaviors similar to stress. Your health care provider may refer you to a mental health professional for further evaluation.  °TREATMENT  °Stress management is the recommended treatment for stress. The goals of stress management are reducing stressful life events and coping with stress in healthy ways.  °Techniques for reducing stressful life events include the following: °· Stress identification. Self-monitor for stress and identify what causes stress for you. These skills may help you to avoid some stressful events. °· Time management. Set your priorities, keep a calendar of events, and learn to say "no." These tools can help you avoid making too many commitments. °Techniques for coping with stress include the following: °· Rethinking the problem. Try to think realistically about stressful events rather than ignoring them or overreacting. Try to find the positives in a stressful situation rather than focusing on the negatives. °· Exercise. Physical exercise can release both physical and emotional tension. The key is to find a form of exercise you enjoy and do it regularly. °· Relaxation techniques. These relax the body and mind. Examples include yoga, meditation, tai chi, biofeedback, deep   breathing, progressive muscle relaxation, listening to music, being out in nature, journaling, and other hobbies. Again, the key is to find one or more that you enjoy and can do  regularly.  Healthy lifestyle. Eat a balanced diet, get plenty of sleep, and do not smoke. Avoid using alcohol or drugs to relax.  Strong support network. Spend time with family, friends, or other people you enjoy being around.Express your feelings and talk things over with someone you trust. Counseling or talktherapy with a mental health professional may be helpful if you are having difficulty managing stress on your own. Medicine is typically not recommended for the treatment of stress.Talk to your health care provider if you think you need medicine for symptoms of stress. HOME CARE INSTRUCTIONS  Keep all follow-up visits as directed by your health care provider.  Take all medicines as directed by your health care provider. SEEK MEDICAL CARE IF:  Your symptoms get worse or you start having new symptoms.  You feel overwhelmed by your problems and can no longer manage them on your own. SEEK IMMEDIATE MEDICAL CARE IF:  You feel like hurting yourself or someone else.   This information is not intended to replace advice given to you by your health care provider. Make sure you discuss any questions you have with your health care provider.   Document Released: 05/01/2001 Document Revised: 11/26/2014 Document Reviewed: 06/30/2013 Elsevier Interactive Patient Education 2016 [employer] Insomnia Insomnia is a sleep disorder that makes it difficult to fall asleep or to stay asleep. Insomnia can cause tiredness (fatigue), low energy, difficulty concentrating, mood swings, and poor performance at work or school.  There are three different ways to classify insomnia:  Difficulty falling asleep.  Difficulty staying asleep.  Waking up too early in the morning. Any type of insomnia can be long-term (chronic) or short-term (acute). Both are common. Short-term insomnia usually lasts for three months or less. Chronic insomnia occurs at least three times a week for longer than three  months. CAUSES  Insomnia may be caused by another condition, situation, or substance, such as:  Anxiety.  Certain medicines.  Gastroesophageal reflux disease (GERD) or other gastrointestinal conditions.  Asthma or other breathing conditions.  Restless legs syndrome, sleep apnea, or other sleep disorders.  Chronic pain.  Menopause. This may include hot flashes.  Stroke.  Abuse of alcohol, tobacco, or illegal drugs.  Depression.  Caffeine.   Neurological disorders, such as Alzheimer disease.  An overactive thyroid (hyperthyroidism). The cause of insomnia may not be known. RISK FACTORS Risk factors for insomnia include:  Gender. Women are more commonly affected than men.  Age. Insomnia is more common as you get older.  Stress. This may involve your professional or personal life.  Income. Insomnia is more common in people with lower income.  Lack of exercise.   Irregular work schedule or night shifts.  Traveling between different time zones. SIGNS AND SYMPTOMS If you have insomnia, trouble falling asleep or trouble staying asleep is the main symptom. This may lead to other symptoms, such as:  Feeling fatigued.  Feeling nervous about going to sleep.  Not feeling rested in the morning.  Having trouble concentrating.  Feeling irritable, anxious, or depressed. TREATMENT  Treatment for insomnia depends on the cause. If your insomnia is caused by an underlying condition, treatment will focus on addressing the condition. Treatment may also include:   Medicines to help you sleep.  Counseling or therapy.  Lifestyle adjustments. HOME CARE INSTRUCTIONS   Take  medicines only as directed by your health care provider.  Keep regular sleeping and waking hours. Avoid naps.  Keep a sleep diary to help you and your health care provider figure out what could be causing your insomnia. Include:   When you sleep.  When you wake up during the night.  How well you  sleep.   How rested you feel the next day.  Any side effects of medicines you are taking.  What you eat and drink.   Make your bedroom a comfortable place where it is easy to fall asleep:  Put up shades or special blackout curtains to block light from outside.  Use a white noise machine to block noise.  Keep the temperature cool.   Exercise regularly as directed by your health care provider. Avoid exercising right before bedtime.  Use relaxation techniques to manage stress. Ask your health care provider to suggest some techniques that may work well for you. These may include:  Breathing exercises.  Routines to release muscle tension.  Visualizing peaceful scenes.  Cut back on alcohol, caffeinated beverages, and cigarettes, especially close to bedtime. These can disrupt your sleep.  Do not overeat or eat spicy foods right before bedtime. This can lead to digestive discomfort that can make it hard for you to sleep.  Limit screen use before bedtime. This includes:  Watching TV.  Using your smartphone, tablet, and computer.  Stick to a routine. This can help you fall asleep faster. Try to do a quiet activity, brush your teeth, and go to bed at the same time each night.  Get out of bed if you are still awake after 15 minutes of trying to sleep. Keep the lights down, but try reading or doing a quiet activity. When you feel sleepy, go back to bed.  Make sure that you drive carefully. Avoid driving if you feel very sleepy.  Keep all follow-up appointments as directed by your health care provider. This is important. SEEK MEDICAL CARE IF:   You are tired throughout the day or have trouble in your daily routine due to sleepiness.  You continue to have sleep problems or your sleep problems get worse. SEEK IMMEDIATE MEDICAL CARE IF:   You have serious thoughts about hurting yourself or someone else.   This information is not intended to replace advice given to you by your  health care provider. Make sure you discuss any questions you have with your health care provider.   Document Released: 11/02/2000 Document Revised: 07/27/2015 Document Reviewed: 08/06/2014 Elsevier Interactive Patient Education 2016 [employer].   Migraine Headache A migraine headache is an intense, throbbing pain on one or both sides of your head. A migraine can last for 30 minutes to several hours. CAUSES  The exact cause of a migraine headache is not always known. However, a migraine may be caused when nerves in the brain become irritated and release chemicals that cause inflammation. This causes pain. Certain things may also trigger migraines, such as:  Alcohol.  Smoking.  Stress.  Menstruation.  Aged cheeses.  Foods or drinks that contain nitrates, glutamate, aspartame, or tyramine.  Lack of sleep.  Chocolate.  Caffeine.  Hunger.  Physical exertion.  Fatigue.  Medicines used to treat chest pain (nitroglycerine), birth control pills, estrogen, and some blood pressure medicines. SIGNS AND SYMPTOMS  Pain on one or both sides of your head.  Pulsating or throbbing pain.  Severe pain that prevents daily activities.  Pain that is aggravated by any physical  activity.  Nausea, vomiting, or both.  Dizziness.  Pain with exposure to bright lights, loud noises, or activity.  General sensitivity to bright lights, loud noises, or smells. Before you get a migraine, you may get warning signs that a migraine is coming (aura). An aura may include:  Seeing flashing lights.  Seeing bright spots, halos, or zigzag lines.  Having tunnel vision or blurred vision.  Having feelings of numbness or tingling.  Having trouble talking.  Having muscle weakness. DIAGNOSIS  A migraine headache is often diagnosed based on:  Symptoms.  Physical exam.  A CT scan or MRI of your head. These imaging tests cannot diagnose migraines, but they can help rule out other causes of  headaches. TREATMENT Medicines may be given for pain and nausea. Medicines can also be given to help prevent recurrent migraines.  HOME CARE INSTRUCTIONS  Only take over-the-counter or prescription medicines for pain or discomfort as directed by your health care provider. The use of long-term narcotics is not recommended.  Lie down in a dark, quiet room when you have a migraine.  Keep a journal to find out what may trigger your migraine headaches. For example, write down:  What you eat and drink.  How much sleep you get.  Any change to your diet or medicines.  Limit alcohol consumption.  Quit smoking if you smoke.  Get 7-9 hours of sleep, or as recommended by your health care provider.  Limit stress.  Keep lights dim if bright lights bother you and make your migraines worse. SEEK IMMEDIATE MEDICAL CARE IF:   Your migraine becomes severe.  You have a fever.  You have a stiff neck.  You have vision loss.  You have muscular weakness or loss of muscle control.  You start losing your balance or have trouble walking.  You feel faint or pass out.  You have severe symptoms that are different from your first symptoms. MAKE SURE YOU:   Understand these instructions.  Will watch your condition.  Will get help right away if you are not doing well or get worse.   This information is not intended to replace advice given to you by your health care provider. Make sure you discuss any questions you have with your health care provider.   Document Released: 11/05/2005 Document Revised: 11/26/2014 Document Reviewed: 07/13/2013 Elsevier Interactive Patient Education [employer].

## 2016-05-27 NOTE — ED Notes (Signed)
Pt reports onset of migraine last night, took tylenol without relief.

## 2016-05-27 NOTE — ED Provider Notes (Signed)
CSN: [id number]     Arrival date & time 05/27/16  0252 History   First MD Initiated Contact with Patient 05/27/16 [phone]     Chief Complaint  Patient presents with  . Headache     (Consider location/radiation/quality/duration/timing/severity/associated sxs/prior Treatment) HPI   The patient has a past medical history of prostate cancer and migraine headaches. He states that this headache has been persisting for 2 hours prior to arrival. He reports not having slept in 2 days because he has been very stressed out and unable to sleep. He endorses working out to help him sleep but denies trying any other sleep aid. This evening after not sleeping he developed left sided frontal, parietal, temporal headache. Associated photophobia. Pain originally 15/10. He says he has had the same headache "8 times in the past". He tried Tylenol for his headache prior to arrival. Ten minutes before my evaluation his symptoms improved incidentally to a 3/10.    ROS: The patient denies confusion, diaphoresis, abdominal pains, N/V/D, gas, dysuria, abnormal  bleeding, genital discharge, fever,  weakness (general or focal), confusion, change of vision,  dysphagia, aphagia, shortness of breath, lower extremity swelling, rash, neck pain, chest pain, shortness of breath,  back pain.   Past Medical History  Diagnosis Date  . Cancer [facility])     Prostate   Past Surgical History  Procedure Laterality Date  . Prostate surgery      Removal of prostate   Family History  Problem Relation Age of Onset  . Cancer Neg Hx    Social History  Substance Use Topics  . Smoking status: Current Every Day Smoker -- 0.50 packs/day    Types: Cigarettes  . Smokeless tobacco: Never Used  . Alcohol Use: No    Review of Systems  Review of Systems All other systems negative except as documented in the HPI. All pertinent positives and negatives as reviewed in the HPI.   Allergies  Review of patient's allergies indicates no known  allergies.  Home Medications   Prior to Admission medications   Medication Sig Start Date End Date Taking? Authorizing Provider  diltiazem 2 % GEL Apply 1 application topically 2 (two) times daily. 02/22/15   Waldemar Dickens, MD  glycerin adult (GLYCERIN ADULT) 2 G SUPP Place 1 suppository rectally once. 02/22/15   Waldemar Dickens, MD   BP 137/90 mmHg  Pulse 60  Temp(Src) 98.2 F (36.8 C) (Oral)  Resp 18  Ht 6' (1.829 m)  Wt 86.183 kg  BMI 25.76 kg/m2  SpO2 98% Physical Exam  Constitutional: He appears well-developed and well-nourished. No distress.  HENT:  Head: Normocephalic and atraumatic.  Eyes: Pupils are equal, round, and reactive to light.  Neck: Normal range of motion. Neck supple. No spinous process tenderness and no muscular tenderness present.  Cardiovascular: Normal rate and regular rhythm.   Pulmonary/Chest: Effort normal.  Abdominal: Soft.  Neurological: He is alert.  Cranial nerves grossly intact on exam. Pt alert and oriented x 3 Upper and lower extremity strength is symmetrical and physiologic Normal muscular tone No facial droop Coordination intact, no limb ataxia,No pronator drift   Skin: Skin is warm and dry.  Nursing note and vitals reviewed.   ED Course  Procedures (including critical care time) Labs Review Labs Reviewed - No data to display  Imaging Review No results found. I have personally reviewed and evaluated these images and lab results as part of my medical decision-making.   EKG Interpretation None  MDM   Final diagnoses:  Other migraine without status migrainosus, not intractable  Stress   Medications  diphenhydrAMINE (BENADRYL) injection 25 mg (25 mg Intramuscular Given 05/27/16 0530)  ondansetron (ZOFRAN-ODT) disintegrating tablet 4 mg (4 mg Oral Given 05/27/16 0530)  metoCLOPramide (REGLAN) injection 10 mg (10 mg Intramuscular Given 05/27/16 0530)   Pt HA treated and improved while in ED, it had already improved  significantly on its own with the tylenol.  Presentation is like pts previous HA and non concerning for [facility], [facility], Meningitis, or temporal arteritis. Pt is afebrile with no focal neuro deficits, nuchal rigidity, or change in vision. Pt is to follow up with PCP to discuss prophylactic medication. Pt verbalizes understanding and is agreeable with plan to dc.      Delos Haring, PA-C 05/27/16 [id number]  Charlesetta Shanks, MD 05/29/16 1356

## 2017-05-04 ENCOUNTER — Ambulatory Visit (HOSPITAL_COMMUNITY)
Admission: EM | Admit: 2017-05-04 | Discharge: 2017-05-04 | Disposition: A | Discharge status: Home / Self Care | Payer: BLUE CROSS/BLUE SHIELD | Attending: Family Medicine | Admitting: Family Medicine

## 2017-05-04 ENCOUNTER — Encounter (HOSPITAL_COMMUNITY): Payer: Self-pay | Admitting: *Deleted

## 2017-05-04 DIAGNOSIS — R220 Localized swelling, mass and lump, head: Secondary | ICD-10-CM

## 2017-05-04 DIAGNOSIS — K061 Gingival enlargement: Secondary | ICD-10-CM | POA: Diagnosis not present

## 2017-05-04 MED ORDER — HYDROCODONE-ACETAMINOPHEN 5-325 MG PO TABS: 1.0000 | ORAL_TABLET | Freq: Four times a day (QID) | ORAL | 0 refills | Status: DC | PRN

## 2017-05-04 MED ORDER — AMOXICILLIN-POT CLAVULANATE 875-125 MG PO TABS: 1.0000 | ORAL_TABLET | Freq: Two times a day (BID) | ORAL | 0 refills | Status: DC

## 2017-05-04 NOTE — ED Triage Notes (Signed)
C/O oral swelling x 3 days without fever - pt believes it's from his dental bridge.  Denies any tongue or throat swelling.

## 2017-05-04 NOTE — Discharge Instructions (Signed)
Follow up with your dentist in 48 hours if not improving. If worsening return here or proceed to the Emergency Department.

## 2017-05-04 NOTE — ED Provider Notes (Signed)
[id number]  05/04/17 1628  ASSESSMENT & PLAN:  Final diagnoses:  Swelling of upper lip  Swelling of gums   Will treat empirically with antibiotics. He plans f/u with dentist at start of week. May return here if needed.\   Meds ordered this encounter  Medications  . amoxicillin-clavulanate (AUGMENTIN) 875-125 MG tablet    Sig: Take 1 tablet by mouth every 12 (twelve) hours.    Dispense:  14 tablet    Refill:  0  . HYDROcodone-acetaminophen (NORCO/VICODIN) 5-325 MG tablet    Sig: Take 1 tablet by mouth every 6 (six) hours as needed for moderate pain or severe pain.    Dispense:  10 tablet    Refill:  0     Reviewed expectations re: course of current medical issues.  Discussed self-management of symptoms.  Outlined signs and symptoms indicating need for more acute intervention.  Patient verbalized understanding. Questions answered.  After Visit Summary given.   SUBJECTIVE:  Joshua Velazquez is a 61 y.o. male who presents with complaint of upper lip and gum swelling for several days. Has had bridge work in past and his dentist telling him he will lose it eventually secondary to bone loss. Painful now but able to eat and drink without much trouble. OTC without relief. Has had similar symptoms in past that clear with antibiotics.   ROS: As per HPI. Afebrile.   OBJECTIVE: Vitals:   05/04/17 1641  BP: 109/66  Pulse: 73  Resp: 16  Temp: 99 F (37.2 C)   Vitals:   05/04/17 1641  BP: 109/66  Pulse: 73  Resp: 16  Temp: 99 F (37.2 C)  TempSrc: Oral  SpO2: 96%     No distress. Upper lip and gum below with swelling and tenderness to palpation. No specific areas of fluctuance noted. Teeth intact. Neck with FROM.  Throat normal. No cervical LAD.  Labs Reviewed - No data to display  Patient has no known allergies.  PMHx, SurgHx, SocialHx, Medications, and Allergies were reviewed in the Visit Navigator and updated as appropriate.   Prior to Admission medications    Medication Sig Start Date End Date Taking? Authorizing Provider  amoxicillin-clavulanate (AUGMENTIN) 875-125 MG tablet Take 1 tablet by mouth every 12 (twelve) hours. 05/04/17   Vanessa Kick, MD  diltiazem 2 % GEL Apply 1 application topically 2 (two) times daily. 02/22/15   Waldemar Dickens, MD  glycerin adult (GLYCERIN ADULT) 2 G SUPP Place 1 suppository rectally once. 02/22/15   Waldemar Dickens, MD  HYDROcodone-acetaminophen (NORCO/VICODIN) 5-325 MG tablet Take 1 tablet by mouth every 6 (six) hours as needed for moderate pain or severe pain. 05/04/17   Vanessa Kick, MD         Vanessa Kick, MD 05/04/17 [phone]

## 2017-10-04 ENCOUNTER — Encounter: Payer: Self-pay | Admitting: Gastroenterology

## 2017-10-05 ENCOUNTER — Encounter (HOSPITAL_COMMUNITY): Payer: Self-pay | Admitting: *Deleted

## 2017-10-05 ENCOUNTER — Ambulatory Visit (HOSPITAL_COMMUNITY)
Admission: EM | Admit: 2017-10-05 | Discharge: 2017-10-05 | Disposition: A | Discharge status: Home / Self Care | Payer: BLUE CROSS/BLUE SHIELD | Attending: Emergency Medicine | Admitting: Emergency Medicine

## 2017-10-05 DIAGNOSIS — K056 Periodontal disease, unspecified: Secondary | ICD-10-CM | POA: Diagnosis not present

## 2017-10-05 DIAGNOSIS — K0500 Acute gingivitis, plaque induced: Secondary | ICD-10-CM | POA: Diagnosis not present

## 2017-10-05 MED ORDER — AMOXICILLIN-POT CLAVULANATE 875-125 MG PO TABS: 1.0000 | ORAL_TABLET | Freq: Two times a day (BID) | ORAL | 0 refills | Status: DC

## 2017-10-05 NOTE — ED Provider Notes (Signed)
[place]    CSN: [id number] Arrival date & time: 10/05/17  1421     History   Chief Complaint Chief Complaint  Patient presents with  . Medication Refill    HPI Joshua Velazquez is a 61 y.o. male.   61 year old male complaining of upper frontal gingival pain with minor swelling beneath the lip. He was here in June of this year for the same complaint and treated with Augmentin. After this treatment I will of the symptoms abated. States he has had bridgework in the past and apparently has poor bony structure and was advised that this may deteriorate in the future.      Past Medical History:  Diagnosis Date  . Cancer [facility])    Prostate    There are no active problems to display for this patient.   Past Surgical History:  Procedure Laterality Date  . PROSTATE SURGERY     Removal of prostate       Home Medications    Prior to Admission medications   Medication Sig Start Date End Date Taking? Authorizing Provider  amoxicillin-clavulanate (AUGMENTIN) 875-125 MG tablet Take 1 tablet every 12 (twelve) hours by mouth. 10/05/17   Angelli Baruch, Shanon Brow, NP  diltiazem 2 % GEL Apply 1 application topically 2 (two) times daily. 02/22/15   Waldemar Dickens, MD    Family History Family History  Problem Relation Age of Onset  . Diabetes Mother   . Hypertension Father   . Cancer Neg Hx     Social History Social History   Tobacco Use  . Smoking status: Current Every Day Smoker    Packs/day: 0.50    Types: Cigarettes  . Smokeless tobacco: Never Used  Substance Use Topics  . Alcohol use: No  . Drug use: No     Allergies   Patient has no known allergies.   Review of Systems Review of Systems  Constitutional: Negative.   HENT: Positive for dental problem.        No toothache. No fever or chills.  Respiratory: Negative.   Neurological: Negative.   All other systems reviewed and are negative.    Physical Exam Triage Vital Signs ED Triage Vitals  Enc  Vitals Group     BP 10/05/17 1507 113/76     Pulse Rate 10/05/17 1507 71     Resp 10/05/17 1507 17     Temp 10/05/17 1507 97.9 F (36.6 C)     Temp Source 10/05/17 1507 Oral     SpO2 10/05/17 1507 98 %     Weight --      Height --      Head Circumference --      Peak Flow --      Pain Score 10/05/17 1505 5     Pain Loc --      Pain Edu? --      Excl. in [place]? --    No data found.  Updated Vital Signs BP 113/76 (BP Location: Right Arm)   Pulse 71   Temp 97.9 F (36.6 C) (Oral)   Resp 17   SpO2 98%   Visual Acuity Right Eye Distance:   Left Eye Distance:   Bilateral Distance:    Right Eye Near:   Left Eye Near:    Bilateral Near:     Physical Exam  Constitutional: He is oriented to person, place, and time. He appears well-developed and well-nourished. No distress.  HENT:  Mouth/Throat: Oropharynx is clear and moist.  No dental tenderness. Gingival tissue across the upper frontal maxilla mildly erythematous and gray. No swelling, no abscess formation no drainage and only minimal tenderness. There is tenderness when palpating skin soft tissue just beneath the nose across the alveolar ridge.   Eyes: EOM are normal.  Neck: Neck supple.  Pulmonary/Chest: Effort normal.  Musculoskeletal: Normal range of motion. He exhibits no edema.  Neurological: He is alert and oriented to person, place, and time.  Skin: Skin is warm and dry.  Nursing note and vitals reviewed.    UC Treatments / Results  Labs (all labs ordered are listed, but only abnormal results are displayed) Labs Reviewed - No data to display  EKG  EKG Interpretation None       Radiology No results found.  Procedures Procedures (including critical care time)  Medications Ordered in UC Medications - No data to display   Initial Impression / Assessment and Plan / UC Course  I have reviewed the triage vital signs and the nursing notes.  Pertinent labs & imaging results that were available during  my care of the patient were reviewed by me and considered in my medical decision making (see chart for details).    Take antibiotic as directed. Continue the warm salt water rinses and  may try a fluoride rinse twice daily. Follow-up with dentist or with [facility] to sent is possible    Final Clinical Impressions(s) / UC Diagnoses   Final diagnoses:  Gingivitis, acute  Chronic periodontal disease    ED Discharge Orders        Ordered    amoxicillin-clavulanate (AUGMENTIN) 875-125 MG tablet  Every 12 hours     10/05/17 1527       Controlled Substance Prescriptions [place] Controlled Substance Registry consulted? Not Applicable   Janne Napoleon, NP 10/05/17 [phone]

## 2017-10-05 NOTE — ED Triage Notes (Signed)
Patient states that he thinks his bum is becoming infected again, upper gum. Patient has bridge in place. Patient states he was given prescription for augmentin on 6/16 and that helped. States it is the same kind of symptoms.

## 2017-10-05 NOTE — Discharge Instructions (Signed)
Take antibiotic as directed. Continue the warm salt water rinses and  may try a fluoride rinse twice daily. Follow-up with dentist or with [facility] to sent is possible

## 2018-01-02 ENCOUNTER — Other Ambulatory Visit: Payer: Self-pay

## 2018-01-02 ENCOUNTER — Ambulatory Visit (HOSPITAL_COMMUNITY)
Admission: EM | Admit: 2018-01-02 | Discharge: 2018-01-02 | Disposition: A | Discharge status: Home / Self Care | Payer: BLUE CROSS/BLUE SHIELD | Attending: Physician Assistant | Admitting: Physician Assistant

## 2018-01-02 ENCOUNTER — Encounter (HOSPITAL_COMMUNITY): Payer: Self-pay | Admitting: Emergency Medicine

## 2018-01-02 DIAGNOSIS — K047 Periapical abscess without sinus: Secondary | ICD-10-CM | POA: Diagnosis not present

## 2018-01-02 MED ORDER — AMOXICILLIN-POT CLAVULANATE 875-125 MG PO TABS: 1.0000 | ORAL_TABLET | Freq: Two times a day (BID) | ORAL | 0 refills | Status: DC

## 2018-01-02 NOTE — Discharge Instructions (Signed)
The Augmentin is waiting at the pharmacy for you.  I would recommend trying to get in with your dentist in the middle of next week.  If any point you get a fever, chills, nausea, dizziness and the problem seems to be getting worse on medication I would recommend going to the emergency department.  Take 400-600 mg of ibuprofen for pain

## 2018-01-02 NOTE — ED Provider Notes (Signed)
01/02/2018 11:05 AM   DOB: [birth year] / MRN: [id number]  SUBJECTIVE:  Joshua Velazquez is a 62 y.o. male presenting for gingival pain, tenderness as well as a Exudate.  This is not a new problem.  He was prescribed Augmentin about 2 months ago for the same problem but has not been able to make it into the dentist as he has had some dental insurance difficulties.  He tells me he now has an appointment lined up.  He is requesting more Augmentin.  He has No Known Allergies.   He  has a past medical history of Cancer ([place]).    He  reports that he has been smoking cigarettes.  He has been smoking about 0.50 packs per day. he has never used smokeless tobacco. He reports that he does not drink alcohol or use drugs. He  has no sexual activity history on file. The patient  has a past surgical history that includes Prostate surgery.  His family history includes Diabetes in his mother; Hypertension in his father.  Review of Systems  Constitutional: Negative for chills, diaphoresis and fever.  Gastrointestinal: Negative for nausea.  Skin: Negative for rash.  Neurological: Negative for dizziness.    OBJECTIVE:  BP 122/77   Pulse 85   Temp 98.2 F (36.8 C)   Resp 16   SpO2 100%   Physical Exam  HENT:  Right Ear: Hearing, tympanic membrane, external ear and ear canal normal.  Left Ear: Hearing, tympanic membrane, external ear and ear canal normal.  Nose: Nose normal. Right sinus exhibits no maxillary sinus tenderness and no frontal sinus tenderness. Left sinus exhibits no maxillary sinus tenderness and no frontal sinus tenderness.  Mouth/Throat: Uvula is midline, oropharynx is clear and moist and mucous membranes are normal. No oropharyngeal exudate, posterior oropharyngeal edema or tonsillar abscesses.    Eyes: Conjunctivae are normal. Pupils are equal, round, and reactive to light.  Cardiovascular: Normal rate.  Pulmonary/Chest: Effort normal.  Lymphadenopathy:       Head (right side): No  submandibular and no tonsillar adenopathy present.       Head (left side): No submandibular and no tonsillar adenopathy present.    He has no cervical adenopathy.  Vitals reviewed.   No results found for this or any previous visit (from the past 72 hour(s)).  No results found.  ASSESSMENT AND PLAN:  No orders of the defined types were placed in this encounter.    Dental abscess: I will refill his Augmentin.  I have encouraged him to seek the care of a dentist hopefully within the middle of next week.      The patient is advised to call or return to clinic if he does not see an improvement in symptoms, or to seek the care of the closest emergency department if he worsens with the above plan.   Philis Fendt, MHS, PA-C 01/02/2018 11:05 AM    Tereasa Coop, PA-C 01/02/18 1105

## 2018-01-02 NOTE — ED Triage Notes (Signed)
Pt states "I have an infection I think in my tooth and its swollen, I was given medication and I need to get it again to get the infectino to go away". Pt has old empty script of augmentin.

## 2018-12-20 ENCOUNTER — Encounter (HOSPITAL_COMMUNITY): Payer: Self-pay

## 2018-12-20 ENCOUNTER — Other Ambulatory Visit: Payer: Self-pay

## 2018-12-20 ENCOUNTER — Ambulatory Visit (HOSPITAL_COMMUNITY)
Admission: EM | Admit: 2018-12-20 | Discharge: 2018-12-20 | Disposition: A | Discharge status: Home / Self Care | Payer: BLUE CROSS/BLUE SHIELD | Attending: Family Medicine | Admitting: Family Medicine

## 2018-12-20 DIAGNOSIS — K0889 Other specified disorders of teeth and supporting structures: Secondary | ICD-10-CM

## 2018-12-20 MED ORDER — IBUPROFEN 600 MG PO TABS: 600.0000 mg | ORAL_TABLET | Freq: Four times a day (QID) | ORAL | 0 refills | Status: AC | PRN

## 2018-12-20 MED ORDER — AMOXICILLIN-POT CLAVULANATE 875-125 MG PO TABS: 1.0000 | ORAL_TABLET | Freq: Two times a day (BID) | ORAL | 0 refills | Status: DC

## 2018-12-20 NOTE — ED Triage Notes (Signed)
Pt presents today with what he thinks is an abscess in the front upper area of his mouth. States that it is causing his whole mouth to hurt and also causing a headache. Has had this problem in the past and was given amoxicillin which helped him a lot he states.

## 2018-12-22 NOTE — ED Provider Notes (Signed)
[place]   [id number] 12/20/18 Arrival Time: 9485  ASSESSMENT & PLAN:  1. Pain, dental    No sign of abscess requiring I&D at this time. Discussed. To start antibiotic asap.  Meds ordered this encounter  Medications  . amoxicillin-clavulanate (AUGMENTIN) 875-125 MG tablet    Sig: Take 1 tablet by mouth every 12 (twelve) hours.    Dispense:  14 tablet    Refill:  0  . ibuprofen (ADVIL,MOTRIN) 600 MG tablet    Sig: Take 1 tablet (600 mg total) by mouth every 6 (six) hours as needed.    Dispense:  30 tablet    Refill:  0   Dental resource written instructions given. He will schedule dental evaluation as soon as possible. May return here as needed.  Reviewed expectations re: course of current medical issues. Questions answered. Outlined signs and symptoms indicating need for more acute intervention. Patient verbalized understanding. After Visit Summary given.   SUBJECTIVE:  Joshua Velazquez is a 63 y.o. male who reports gradual onset of upper frontal dental pain described as "soreness". Present for several days. Fever: absent. Tolerating PO intake but reports pain with chewing. Normal swallowing. He does not see a dentist regularly but does have one. No neck swelling or pain. OTC analgesics without relief. H/O similar responding to antibiotic tx.  ROS: As per HPI.  OBJECTIVE: Vitals:   12/20/18 1323  BP: 132/81  Pulse: 76  Resp: 16  Temp: 98.1 F (36.7 C)  TempSrc: Oral  SpO2: 96%    General appearance: alert; no distress HENT: normocephalic; atraumatic; dentition: fair; upper gums without areas of fluctuance, drainage, or bleeding but with tenderness to palpation over his front left upper gum; normal jaw movement without difficulty Neck: supple without LAD; FROM; trachea midline Lungs: normal respirations; unlabored Skin: warm and dry Psychological: alert and cooperative; normal mood and affect  No Known Allergies  Past Medical History:  Diagnosis Date   . Cancer [facility])    Prostate   Social History   Socioeconomic History  . Marital status: Divorced    Spouse name: Not on file  . Number of children: Not on file  . Years of education: Not on file  . Highest education level: Not on file  Occupational History  . Not on file  Social Needs  . Financial resource strain: Not on file  . Food insecurity:    Worry: Not on file    Inability: Not on file  . Transportation needs:    Medical: Not on file    Non-medical: Not on file  Tobacco Use  . Smoking status: Current Every Day Smoker    Packs/day: 0.50    Types: Cigarettes  . Smokeless tobacco: Never Used  Substance and Sexual Activity  . Alcohol use: No  . Drug use: No  . Sexual activity: Not on file  Lifestyle  . Physical activity:    Days per week: Not on file    Minutes per session: Not on file  . Stress: Not on file  Relationships  . Social connections:    Talks on phone: Not on file    Gets together: Not on file    Attends religious service: Not on file    Active member of club or organization: Not on file    Attends meetings of clubs or organizations: Not on file    Relationship status: Not on file  . Intimate partner violence:    Fear of current or ex partner: Not on  file    Emotionally abused: Not on file    Physically abused: Not on file    Forced sexual activity: Not on file  Other Topics Concern  . Not on file  Social History Narrative  . Not on file   Family History  Problem Relation Age of Onset  . Diabetes Mother   . Hypertension Father   . Cancer Neg Hx    Past Surgical History:  Procedure Laterality Date  . PROSTATE SURGERY     Removal of prostate     Vanessa Kick, MD 12/24/18 [phone]

## 2019-04-29 ENCOUNTER — Encounter: Payer: Self-pay | Admitting: Gastroenterology

## 2019-05-27 ENCOUNTER — Encounter: Payer: BLUE CROSS/BLUE SHIELD | Admitting: Gastroenterology

## 2019-06-04 ENCOUNTER — Encounter: Payer: Self-pay | Admitting: Gastroenterology

## 2020-04-05 ENCOUNTER — Other Ambulatory Visit: Payer: Self-pay

## 2020-04-06 ENCOUNTER — Ambulatory Visit: Payer: 59 | Admitting: Internal Medicine

## 2020-04-06 ENCOUNTER — Encounter: Payer: Self-pay | Admitting: Internal Medicine

## 2020-04-06 VITALS — BP 110/78 | HR 72 | Temp 97.8°F | Ht 72.0 in | Wt 192.9 lb

## 2020-04-06 DIAGNOSIS — Z8546 Personal history of malignant neoplasm of prostate: Secondary | ICD-10-CM

## 2020-04-06 DIAGNOSIS — Z0000 Encounter for general adult medical examination without abnormal findings: Secondary | ICD-10-CM | POA: Diagnosis not present

## 2020-04-06 DIAGNOSIS — N5231 Erectile dysfunction following radical prostatectomy: Secondary | ICD-10-CM

## 2020-04-06 DIAGNOSIS — N529 Male erectile dysfunction, unspecified: Secondary | ICD-10-CM | POA: Insufficient documentation

## 2020-04-06 DIAGNOSIS — Z1211 Encounter for screening for malignant neoplasm of colon: Secondary | ICD-10-CM

## 2020-04-06 DIAGNOSIS — C61 Malignant neoplasm of prostate: Secondary | ICD-10-CM | POA: Insufficient documentation

## 2020-04-06 LAB — LIPID PANEL
Cholesterol: 213 mg/dL — ABNORMAL HIGH (ref 0–200)
HDL: 65.5 mg/dL (ref >39.00)
LDL Cholesterol: 135 mg/dL — ABNORMAL HIGH (ref 0–99)
NonHDL: 147.25
Total CHOL/HDL Ratio: 3
Triglycerides: 61 mg/dL (ref 0.0–149.0)
VLDL: 12.2 mg/dL (ref 0.0–40.0)

## 2020-04-06 LAB — COMPREHENSIVE METABOLIC PANEL
ALT: 19 U/L (ref 0–53)
AST: 24 U/L (ref 0–37)
Albumin: 4.5 g/dL (ref 3.5–5.2)
Alkaline Phosphatase: 83 U/L (ref 39–117)
BUN: 10 mg/dL (ref 6–23)
CO2: 29 mEq/L (ref 19–32)
Calcium: 9.4 mg/dL (ref 8.4–10.5)
Chloride: 104 mEq/L (ref 96–112)
Creatinine, Ser: 1.05 mg/dL (ref 0.40–1.50)
GFR: 86.21 mL/min (ref >60.00)
Glucose, Bld: 104 mg/dL — ABNORMAL HIGH (ref 70–99)
Potassium: 3.8 mEq/L (ref 3.5–5.1)
Sodium: 140 mEq/L (ref 135–145)
Total Bilirubin: 0.5 mg/dL (ref 0.2–1.2)
Total Protein: 7.2 g/dL (ref 6.0–8.3)

## 2020-04-06 LAB — CBC WITH DIFFERENTIAL/PLATELET
Basophils Absolute: 0 10*3/uL (ref 0.0–0.1)
Basophils Relative: 0.8 % (ref 0.0–3.0)
Eosinophils Absolute: 0.1 10*3/uL (ref 0.0–0.7)
Eosinophils Relative: 2.7 % (ref 0.0–5.0)
HCT: 38.9 % — ABNORMAL LOW (ref 39.0–52.0)
Hemoglobin: 13.2 g/dL (ref 13.0–17.0)
Lymphocytes Relative: 50.5 % — ABNORMAL HIGH (ref 12.0–46.0)
Lymphs Abs: 2.3 10*3/uL (ref 0.7–4.0)
MCHC: 33.9 g/dL (ref 30.0–36.0)
MCV: 95.9 fl (ref 78.0–100.0)
Monocytes Absolute: 0.5 10*3/uL (ref 0.1–1.0)
Monocytes Relative: 11.8 % (ref 3.0–12.0)
Neutro Abs: 1.5 10*3/uL (ref 1.4–7.7)
Neutrophils Relative %: 34.2 % — ABNORMAL LOW (ref 43.0–77.0)
Platelets: 240 10*3/uL (ref 150.0–400.0)
RBC: 4.06 Mil/uL — ABNORMAL LOW (ref 4.22–5.81)
RDW: 13.4 % (ref 11.5–15.5)
WBC: 4.5 10*3/uL (ref 4.0–10.5)

## 2020-04-06 LAB — VITAMIN B12: Vitamin B-12: 839 pg/mL (ref 211–911)

## 2020-04-06 LAB — VITAMIN D 25 HYDROXY (VIT D DEFICIENCY, FRACTURES): VITD: 16.99 ng/mL — ABNORMAL LOW (ref 30.00–100.00)

## 2020-04-06 LAB — TSH: TSH: 0.77 u[IU]/mL (ref 0.35–4.50)

## 2020-04-06 LAB — PSA: PSA: 0.01 ng/mL — ABNORMAL LOW (ref 0.10–4.00)

## 2020-04-06 LAB — HEMOGLOBIN A1C: Hgb A1c MFr Bld: 5.7 % (ref 4.6–6.5)

## 2020-04-06 NOTE — Patient Instructions (Addendum)
-Nice seeing you today!!  -Lab work today; will notify you once results are available.  -referrals to urology and GI today.  -See you back in 1 year or sooner as needed.   Preventive Care 72-64 Years Old, Male Preventive care refers to lifestyle choices and visits with your health care provider that can promote health and wellness. This includes:  A yearly physical exam. This is also called an annual well check.  Regular dental and eye exams.  Immunizations.  Screening for certain conditions.  Healthy lifestyle choices, such as eating a healthy diet, getting regular exercise, not using drugs or products that contain nicotine and tobacco, and limiting alcohol use. What can I expect for my preventive care visit? Physical exam Your health care provider will check:  Height and weight. These may be used to calculate body mass index (BMI), which is a measurement that tells if you are at a healthy weight.  Heart rate and blood pressure.  Your skin for abnormal spots. Counseling Your health care provider may ask you questions about:  Alcohol, tobacco, and drug use.  Emotional well-being.  Home and relationship well-being.  Sexual activity.  Eating habits.  Work and work Statistician. What immunizations do I need?  Influenza (flu) vaccine  This is recommended every year. Tetanus, diphtheria, and pertussis (Tdap) vaccine  You may need a Td booster every 10 years. Varicella (chickenpox) vaccine  You may need this vaccine if you have not already been vaccinated. Zoster (shingles) vaccine  You may need this after age 64. Measles, mumps, and rubella (MMR) vaccine  You may need at least one dose of MMR if you were born in 1957 or later. You may also need a second dose. Pneumococcal conjugate (PCV13) vaccine  You may need this if you have certain conditions and were not previously vaccinated. Pneumococcal polysaccharide (PPSV23) vaccine  You may need one or two doses  if you smoke cigarettes or if you have certain conditions. Meningococcal conjugate (MenACWY) vaccine  You may need this if you have certain conditions. Hepatitis A vaccine  You may need this if you have certain conditions or if you travel or work in places where you may be exposed to hepatitis A. Hepatitis B vaccine  You may need this if you have certain conditions or if you travel or work in places where you may be exposed to hepatitis B. Haemophilus influenzae type b (Hib) vaccine  You may need this if you have certain risk factors. Human papillomavirus (HPV) vaccine  If recommended by your health care provider, you may need three doses over 6 months. You may receive vaccines as individual doses or as more than one vaccine together in one shot (combination vaccines). Talk with your health care provider about the risks and benefits of combination vaccines. What tests do I need? Blood tests  Lipid and cholesterol levels. These may be checked every 5 years, or more frequently if you are over 64 years old.  Hepatitis C test.  Hepatitis B test. Screening  Lung cancer screening. You may have this screening every year starting at age 64 if you have a 30-pack-year history of smoking and currently smoke or have quit within the past 15 years.  Prostate cancer screening. Recommendations will vary depending on your family history and other risks.  Colorectal cancer screening. All adults should have this screening starting at age 64 and continuing until age 64. Your health care provider may recommend screening at age 64 if you are at increased  risk. You will have tests every 1-10 years, depending on your results and the type of screening test.  Diabetes screening. This is done by checking your blood sugar (glucose) after you have not eaten for a while (fasting). You may have this done every 1-3 years.  Sexually transmitted disease (STD) testing. Follow these instructions at home: Eating and  drinking  Eat a diet that includes fresh fruits and vegetables, whole grains, lean protein, and low-fat dairy products.  Take vitamin and mineral supplements as recommended by your health care provider.  Do not drink alcohol if your health care provider tells you not to drink.  If you drink alcohol: ? Limit how much you have to 0-2 drinks a day. ? Be aware of how much alcohol is in your drink. In the [place] one drink equals one 12 oz bottle of beer (355 mL), one 5 oz glass of wine (148 mL), or one 1 oz glass of hard liquor (44 mL). Lifestyle  Take daily care of your teeth and gums.  Stay active. Exercise for at least 30 minutes on 5 or more days each week.  Do not use any products that contain nicotine or tobacco, such as cigarettes, e-cigarettes, and chewing tobacco. If you need help quitting, ask your health care provider.  If you are sexually active, practice safe sex. Use a condom or other form of protection to prevent STIs (sexually transmitted infections).  Talk with your health care provider about taking a low-dose aspirin every day starting at age 42. What's next?  Go to your health care provider once a year for a well check visit.  Ask your health care provider how often you should have your eyes and teeth checked.  Stay up to date on all vaccines. This information is not intended to replace advice given to you by your health care provider. Make sure you discuss any questions you have with your health care provider. Document Revised: 10/30/2018 Document Reviewed: 10/30/2018 Elsevier Patient Education  2020 [employer].

## 2020-04-06 NOTE — Progress Notes (Signed)
New Patient Office Visit     This visit occurred during the SARS-CoV-2 public health emergency.  Safety protocols were in place, including screening questions prior to the visit, additional usage of staff PPE, and extensive cleaning of exam room while observing appropriate contact time as indicated for disinfecting solutions.    CC/Reason for Visit: Establish care, annual preventive exam Previous PCP: [facility] Last Visit: 2020  HPI: Joshua Velazquez is a 64 y.o. male who is coming in today for the above mentioned reasons. Past Medical History is significant for: History of prostate cancer with resultant erectile dysfunction.  He has been using a prostaglandin injection as needed for the ED and wants refills of this today.  His previous PCP has been refilling this.  He has not seen a neurologist in around 8 years per his report.  Other than this he has no complaints.  He is overdue for colonoscopy as his last one was in 2008.  He has routine eye and dental care.  He exercises by running 3 to 4 miles 3 times a week.   Past Medical/Surgical History: Past Medical History:  Diagnosis Date  . ED (erectile dysfunction)   . Prostate cancer [facility])     Past Surgical History:  Procedure Laterality Date  . PROSTATE SURGERY     Removal of prostate    Social History:  reports that he has quit smoking. His smoking use included cigarettes. He smoked 0.50 packs per day. He has never used smokeless tobacco. He reports that he does not drink alcohol or use drugs.  Allergies: No Known Allergies  Family History:  Family History  Problem Relation Age of Onset  . Diabetes Mother   . Hypertension Father   . Cancer Neg Hx      Current Outpatient Medications:  .  b complex vitamins capsule, Take 1 capsule by mouth daily., Disp: , Rfl:  .  ibuprofen (ADVIL,MOTRIN) 600 MG tablet, Take 1 tablet (600 mg total) by mouth every 6 (six) hours as needed., Disp: 30 tablet, Rfl: 0 .   Melatonin 10 MG TABS, Take by mouth., Disp: , Rfl:  .  NON FORMULARY, Papa/phento/prostaglandine E1 (1 ml Vial) 30 mg- 1 mg - 40 cc/ml injectable 5 ml Use as directed, Disp: , Rfl:  .  Probiotic Product (PROBIOTIC-10 PO), Take by mouth., Disp: , Rfl:   Review of Systems:  Constitutional: Denies fever, chills, diaphoresis, appetite change and fatigue.  HEENT: Denies photophobia, eye pain, redness, hearing loss, ear pain, congestion, sore throat, rhinorrhea, sneezing, mouth sores, trouble swallowing, neck pain, neck stiffness and tinnitus.   Respiratory: Denies SOB, DOE, cough, chest tightness,  and wheezing.   Cardiovascular: Denies chest pain, palpitations and leg swelling.  Gastrointestinal: Denies nausea, vomiting, abdominal pain, diarrhea, constipation, blood in stool and abdominal distention.  Genitourinary: Denies dysuria, urgency, frequency, hematuria, flank pain and difficulty urinating.  Endocrine: Denies: hot or cold intolerance, sweats, changes in hair or nails, polyuria, polydipsia. Musculoskeletal: Denies myalgias, back pain, joint swelling, arthralgias and gait problem.  Skin: Denies pallor, rash and wound.  Neurological: Denies dizziness, seizures, syncope, weakness, light-headedness, numbness and headaches.  Hematological: Denies adenopathy. Easy bruising, personal or family bleeding history  Psychiatric/Behavioral: Denies suicidal ideation, mood changes, confusion, nervousness, sleep disturbance and agitation    Physical Exam: Vitals:   04/06/20 1429  BP: 110/78  Pulse: 72  Temp: 97.8 F (36.6 C)  TempSrc: Temporal  SpO2: 99%  Weight: 192 lb 14.4 oz (87.5  kg)  Height: 6' (1.829 m)   Body mass index is 26.16 kg/m.  Constitutional: NAD, calm, comfortable Eyes: PERRL, lids and conjunctivae normal ENMT: Mucous membranes are moist. Tympanic membrane is pearly white, no erythema or bulging. Neck: normal, supple, no masses, no thyromegaly Respiratory: clear to  auscultation bilaterally, no wheezing, no crackles. Normal respiratory effort. No accessory muscle use.  Cardiovascular: Regular rate and rhythm, no murmurs / rubs / gallops. No extremity edema. 2+ pedal pulses. .  Abdomen: no tenderness, no masses palpated. No hepatosplenomegaly. Bowel sounds positive.  Musculoskeletal: no clubbing / cyanosis. No joint deformity upper and lower extremities. Good ROM, no contractures. Normal muscle tone.  Skin: no rashes, lesions, ulcers. No induration Neurologic: CN 2-12 grossly intact. Sensation intact, DTR normal. Strength 5/5 in all 4.  Psychiatric: Normal judgment and insight. Alert and oriented x 3. Normal mood.    Impression and Plan:  Encounter for preventive health examination  -He has routine eye and dental care. -He has not received Covid vaccine, I have not received his previous medical records so not sure what other vaccinations he is due for.  Records have been requested today. -Screening labs today. -Healthy lifestyle discussed in detail. -Check PSA today. -He is overdue for screening colonoscopy, I will initiate GI referral today.  Screening for malignant neoplasm of colon  - Plan: Ambulatory referral to Gastroenterology  History of prostate cancer  - Plan: Ambulatory referral to Urology, PSA  Erectile dysfunction after radical prostatectomy -I do not feel comfortable prescribing an injectable prostaglandin for ED. -Refer to urology.     Patient Instructions  -Nice seeing you today!!  -Lab work today; will notify you once results are available.  -referrals to urology and GI today.  -See you back in 1 year or sooner as needed.   Preventive Care 51-47 Years Old, Male Preventive care refers to lifestyle choices and visits with your health care provider that can promote health and wellness. This includes:  A yearly physical exam. This is also called an annual well check.  Regular dental and eye  exams.  Immunizations.  Screening for certain conditions.  Healthy lifestyle choices, such as eating a healthy diet, getting regular exercise, not using drugs or products that contain nicotine and tobacco, and limiting alcohol use. What can I expect for my preventive care visit? Physical exam Your health care provider will check:  Height and weight. These may be used to calculate body mass index (BMI), which is a measurement that tells if you are at a healthy weight.  Heart rate and blood pressure.  Your skin for abnormal spots. Counseling Your health care provider may ask you questions about:  Alcohol, tobacco, and drug use.  Emotional well-being.  Home and relationship well-being.  Sexual activity.  Eating habits.  Work and work Statistician. What immunizations do I need?  Influenza (flu) vaccine  This is recommended every year. Tetanus, diphtheria, and pertussis (Tdap) vaccine  You may need a Td booster every 10 years. Varicella (chickenpox) vaccine  You may need this vaccine if you have not already been vaccinated. Zoster (shingles) vaccine  You may need this after age 46. Measles, mumps, and rubella (MMR) vaccine  You may need at least one dose of MMR if you were born in 1957 or later. You may also need a second dose. Pneumococcal conjugate (PCV13) vaccine  You may need this if you have certain conditions and were not previously vaccinated. Pneumococcal polysaccharide (PPSV23) vaccine  You may need one  or two doses if you smoke cigarettes or if you have certain conditions. Meningococcal conjugate (MenACWY) vaccine  You may need this if you have certain conditions. Hepatitis A vaccine  You may need this if you have certain conditions or if you travel or work in places where you may be exposed to hepatitis A. Hepatitis B vaccine  You may need this if you have certain conditions or if you travel or work in places where you may be exposed to hepatitis  B. Haemophilus influenzae type b (Hib) vaccine  You may need this if you have certain risk factors. Human papillomavirus (HPV) vaccine  If recommended by your health care provider, you may need three doses over 6 months. You may receive vaccines as individual doses or as more than one vaccine together in one shot (combination vaccines). Talk with your health care provider about the risks and benefits of combination vaccines. What tests do I need? Blood tests  Lipid and cholesterol levels. These may be checked every 5 years, or more frequently if you are over 48 years old.  Hepatitis C test.  Hepatitis B test. Screening  Lung cancer screening. You may have this screening every year starting at age 73 if you have a 30-pack-year history of smoking and currently smoke or have quit within the past 15 years.  Prostate cancer screening. Recommendations will vary depending on your family history and other risks.  Colorectal cancer screening. All adults should have this screening starting at age 40 and continuing until age 36. Your health care provider may recommend screening at age 91 if you are at increased risk. You will have tests every 1-10 years, depending on your results and the type of screening test.  Diabetes screening. This is done by checking your blood sugar (glucose) after you have not eaten for a while (fasting). You may have this done every 1-3 years.  Sexually transmitted disease (STD) testing. Follow these instructions at home: Eating and drinking  Eat a diet that includes fresh fruits and vegetables, whole grains, lean protein, and low-fat dairy products.  Take vitamin and mineral supplements as recommended by your health care provider.  Do not drink alcohol if your health care provider tells you not to drink.  If you drink alcohol: ? Limit how much you have to 0-2 drinks a day. ? Be aware of how much alcohol is in your drink. In the [place] one drink equals one 12 oz  bottle of beer (355 mL), one 5 oz glass of wine (148 mL), or one 1 oz glass of hard liquor (44 mL). Lifestyle  Take daily care of your teeth and gums.  Stay active. Exercise for at least 30 minutes on 5 or more days each week.  Do not use any products that contain nicotine or tobacco, such as cigarettes, e-cigarettes, and chewing tobacco. If you need help quitting, ask your health care provider.  If you are sexually active, practice safe sex. Use a condom or other form of protection to prevent STIs (sexually transmitted infections).  Talk with your health care provider about taking a low-dose aspirin every day starting at age 54. What's next?  Go to your health care provider once a year for a well check visit.  Ask your health care provider how often you should have your eyes and teeth checked.  Stay up to date on all vaccines. This information is not intended to replace advice given to you by your health care provider. Make sure you discuss  any questions you have with your health care provider. Document Revised: 10/30/2018 Document Reviewed: 10/30/2018 Elsevier Patient Education  2020 [place], [place] [facility]

## 2020-04-07 ENCOUNTER — Encounter: Payer: Self-pay | Admitting: Gastroenterology

## 2020-04-07 ENCOUNTER — Other Ambulatory Visit: Payer: Self-pay | Admitting: Internal Medicine

## 2020-04-07 ENCOUNTER — Encounter: Payer: Self-pay | Admitting: Internal Medicine

## 2020-04-07 DIAGNOSIS — E785 Hyperlipidemia, unspecified: Secondary | ICD-10-CM | POA: Insufficient documentation

## 2020-04-07 DIAGNOSIS — E559 Vitamin D deficiency, unspecified: Secondary | ICD-10-CM

## 2020-04-07 MED ORDER — VITAMIN D (ERGOCALCIFEROL) 1.25 MG (50000 UNIT) PO CAPS: 50000.0000 [IU] | ORAL_CAPSULE | ORAL | 0 refills | Status: DC

## 2020-04-08 ENCOUNTER — Other Ambulatory Visit: Payer: Self-pay | Admitting: Internal Medicine

## 2020-04-08 DIAGNOSIS — E559 Vitamin D deficiency, unspecified: Secondary | ICD-10-CM

## 2020-05-05 ENCOUNTER — Telehealth: Payer: Self-pay | Admitting: Internal Medicine

## 2020-05-05 ENCOUNTER — Ambulatory Visit (AMBULATORY_SURGERY_CENTER): Payer: Self-pay

## 2020-05-05 ENCOUNTER — Other Ambulatory Visit: Payer: Self-pay

## 2020-05-05 VITALS — Ht 72.0 in | Wt 193.0 lb

## 2020-05-05 DIAGNOSIS — Z1211 Encounter for screening for malignant neoplasm of colon: Secondary | ICD-10-CM

## 2020-05-05 DIAGNOSIS — E559 Vitamin D deficiency, unspecified: Secondary | ICD-10-CM

## 2020-05-05 DIAGNOSIS — Z01818 Encounter for other preprocedural examination: Secondary | ICD-10-CM

## 2020-05-05 MED ORDER — NA SULFATE-K SULFATE-MG SULF 17.5-3.13-1.6 GM/177ML PO SOLN: 1.0000 | Freq: Once | ORAL | 0 refills | Status: AC

## 2020-05-05 MED ORDER — VITAMIN D (ERGOCALCIFEROL) 1.25 MG (50000 UNIT) PO CAPS: 50000.0000 [IU] | ORAL_CAPSULE | ORAL | 0 refills | Status: AC

## 2020-05-05 NOTE — Progress Notes (Signed)
No egg or soy allergy known to patient  No issues with past sedation with any surgeries  or procedures, no intubation problems  No diet pills per patient No home 02 use per patient  No blood thinners per patient  Pt denies issues with constipation  No A fib or A flutter  EMMI video sent to pt's e mail    Suprep code used.  Pt states he is taking stool softener now so daily bm, occasionally needs laxative but usually has daily bm's now.  Due to the COVID-19 pandemic we are asking patients to follow these guidelines. Please only bring one care partner. Please be aware that your care partner may wait in the car in the parking lot or if they feel like they will be too hot to wait in the car, they may wait in the lobby on the 4th floor. All care partners are required to wear a mask the entire time (we do not have any that we can provide them), they need to practice social distancing, and we will do a Covid check for all patient's and care partners when you arrive. Also we will check their temperature and your temperature. If the care partner waits in their car they need to stay in the parking lot the entire time and we will call them on their cell phone when the patient is ready for discharge so they can bring the car to the front of the building. Also all patient's will need to wear a mask into building.

## 2020-05-05 NOTE — Telephone Encounter (Signed)
Patient states he needs his Vitamin D called in to the pharmacy.  His pharmacy states it was never called in to them.    Pharmacy- [facility] on [address]

## 2020-05-17 ENCOUNTER — Other Ambulatory Visit: Payer: Self-pay | Admitting: Gastroenterology

## 2020-05-17 ENCOUNTER — Ambulatory Visit (INDEPENDENT_AMBULATORY_CARE_PROVIDER_SITE_OTHER): Payer: 59

## 2020-05-17 DIAGNOSIS — Z1159 Encounter for screening for other viral diseases: Secondary | ICD-10-CM

## 2020-05-17 LAB — SARS CORONAVIRUS 2 (TAT 6-24 HRS): SARS Coronavirus 2: NEGATIVE

## 2020-05-18 ENCOUNTER — Telehealth: Payer: Self-pay | Admitting: Internal Medicine

## 2020-05-18 NOTE — Telephone Encounter (Signed)
The patient is having a colonoscopy tomorrow and he feels bad since he's been taking the vitamin D. He is going to stop taking the medication.  Would like for someone to call him and discuss the vitamin D.

## 2020-05-19 ENCOUNTER — Encounter: Payer: Self-pay | Admitting: Gastroenterology

## 2020-05-19 ENCOUNTER — Ambulatory Visit (AMBULATORY_SURGERY_CENTER): Payer: 59 | Admitting: Gastroenterology

## 2020-05-19 ENCOUNTER — Other Ambulatory Visit: Payer: Self-pay

## 2020-05-19 VITALS — BP 110/67 | HR 54 | Temp 96.6°F | Resp 20 | Ht 72.0 in | Wt 193.0 lb

## 2020-05-19 DIAGNOSIS — D123 Benign neoplasm of transverse colon: Secondary | ICD-10-CM | POA: Diagnosis not present

## 2020-05-19 DIAGNOSIS — Z1211 Encounter for screening for malignant neoplasm of colon: Secondary | ICD-10-CM

## 2020-05-19 HISTORY — PX: COLONOSCOPY: SHX174

## 2020-05-19 MED ORDER — SODIUM CHLORIDE 0.9 % IV SOLN: 500.0000 mL | Freq: Once | INTRAVENOUS | Status: DC

## 2020-05-19 NOTE — Progress Notes (Signed)
Pt's states no medical or surgical changes since previsit or office visit.  VS CW  

## 2020-05-19 NOTE — Progress Notes (Signed)
Report to PACU, RN, vss, BBS= Clear.  

## 2020-05-19 NOTE — Progress Notes (Signed)
Called to room to assist during endoscopic procedure.  Patient ID and intended procedure confirmed with present staff. Received instructions for my participation in the procedure from the performing physician.  

## 2020-05-19 NOTE — Patient Instructions (Signed)
Handouts given for polyps, diverticulosis and hemorrhoids.  Await pathology results.  YOU HAD AN ENDOSCOPIC PROCEDURE TODAY AT THE [facility]:   Refer to the procedure report that was given to you for any specific questions about what was found during the examination.  If the procedure report does not answer your questions, please call your gastroenterologist to clarify.  If you requested that your care partner not be given the details of your procedure findings, then the procedure report has been included in a sealed envelope for you to review at your convenience later.  YOU SHOULD EXPECT: Some feelings of bloating in the abdomen. Passage of more gas than usual.  Walking can help get rid of the air that was put into your GI tract during the procedure and reduce the bloating. If you had a lower endoscopy (such as a colonoscopy or flexible sigmoidoscopy) you may notice spotting of blood in your stool or on the toilet paper. If you underwent a bowel prep for your procedure, you may not have a normal bowel movement for a few days.  Please Note:  You might notice some irritation and congestion in your nose or some drainage.  This is from the oxygen used during your procedure.  There is no need for concern and it should clear up in a day or so.  SYMPTOMS TO REPORT IMMEDIATELY:   Following lower endoscopy (colonoscopy or flexible sigmoidoscopy):  Excessive amounts of blood in the stool  Significant tenderness or worsening of abdominal pains  Swelling of the abdomen that is new, acute  Fever of 100F or higher   For urgent or emergent issues, a gastroenterologist can be reached at any hour by calling [phone]. Do not use MyChart messaging for urgent concerns.    DIET:  We do recommend a small meal at first, but then you may proceed to your regular diet.  Drink plenty of fluids but you should avoid alcoholic beverages for 24 hours.  ACTIVITY:  You should plan to take it easy for  the rest of today and you should NOT DRIVE or use heavy machinery until tomorrow (because of the sedation medicines used during the test).    FOLLOW UP: Our staff will call the number listed on your records 48-72 hours following your procedure to check on you and address any questions or concerns that you may have regarding the information given to you following your procedure. If we do not reach you, we will leave a message.  We will attempt to reach you two times.  During this call, we will ask if you have developed any symptoms of COVID 19. If you develop any symptoms (ie: fever, flu-like symptoms, shortness of breath, cough etc.) before then, please call [phone].  If you test positive for Covid 19 in the 2 weeks post procedure, please call and report this information to us.    If any biopsies were taken you will be contacted by phone or by letter within the next 1-3 weeks.  Please call us at [phone] if you have not heard about the biopsies in 3 weeks.    SIGNATURES/CONFIDENTIALITY: You and/or your care partner have signed paperwork which will be entered into your electronic medical record.  These signatures attest to the fact that that the information above on your After Visit Summary has been reviewed and is understood.  Full responsibility of the confidentiality of this discharge information lies with you and/or your care-partner. 

## 2020-05-19 NOTE — Op Note (Signed)
[place] Patient Name: Joshua Velazquez Procedure Date: 05/19/2020 7:50 AM MRN: [id number] Endoscopist: Mauri Pole , MD Age: 65 Referring MD:  Date of Birth: Jun 02, 1956 Gender: Male Account #: [id number] Procedure:                Colonoscopy Indications:              Screening for colorectal malignant neoplasm Medicines:                Monitored Anesthesia Care Procedure:                Pre-Anesthesia Assessment:                           - Prior to the procedure, a History and Physical                            was performed, and patient medications and                            allergies were reviewed. The patient's tolerance of                            previous anesthesia was also reviewed. The risks                            and benefits of the procedure and the sedation                            options and risks were discussed with the patient.                            All questions were answered, and informed consent                            was obtained. Prior Anticoagulants: The patient has                            taken no previous anticoagulant or antiplatelet                            agents. ASA Grade Assessment: II - A patient with                            mild systemic disease. After reviewing the risks                            and benefits, the patient was deemed in                            satisfactory condition to undergo the procedure.                           After obtaining informed consent, the colonoscope  was passed under direct vision. Throughout the                            procedure, the patient's blood pressure, pulse, and                            oxygen saturations were monitored continuously. The                            Colonoscope was introduced through the anus and                            advanced to the the cecum, identified by                            appendiceal orifice and  ileocecal valve. The                            colonoscopy was performed without difficulty. The                            patient tolerated the procedure well. The quality                            of the bowel preparation was adequate. The                            ileocecal valve, appendiceal orifice, and rectum                            were photographed. Scope In: 8:23:25 AM Scope Out: 6:96:78 AM Scope Withdrawal Time: 0 hours 10 minutes 4 seconds  Total Procedure Duration: 0 hours 13 minutes 34 seconds  Findings:                 The perianal and digital rectal examinations were                            normal.                           A 5 mm polyp was found in the transverse colon. The                            polyp was sessile. The polyp was removed with a                            cold snare. Resection and retrieval were complete.                           A less than 1 mm polyp was found in the transverse                            colon. The polyp was sessile. The polyp was removed  with a cold biopsy forceps. Resection and retrieval                            were complete.                           A few small-mouthed diverticula were found in the                            sigmoid colon and ascending colon.                           Non-bleeding internal hemorrhoids were found during                            retroflexion. The hemorrhoids were small.                           The exam was otherwise without abnormality. Complications:            No immediate complications. Estimated Blood Loss:     Estimated blood loss was minimal. Impression:               - One 5 mm polyp in the transverse colon, removed                            with a cold snare. Resected and retrieved.                           - One less than 1 mm polyp in the transverse colon,                            removed with a cold biopsy forceps. Resected and                             retrieved.                           - Diverticulosis in the sigmoid colon and in the                            ascending colon.                           - Non-bleeding internal hemorrhoids.                           - The examination was otherwise normal. Recommendation:           - Patient has a contact number available for                            emergencies. The signs and symptoms of potential                            delayed complications were discussed with the  patient. Return to normal activities tomorrow.                            Written discharge instructions were provided to the                            patient.                           - Resume previous diet.                           - Continue present medications.                           - Await pathology results.                           - Repeat colonoscopy in 5-10 years for surveillance                            based on pathology results. Mauri Pole, MD 05/19/2020 8:41:37 AM This report has been signed electronically.

## 2020-05-20 NOTE — Telephone Encounter (Signed)
Spoke with patient and he is "feeling better and will continue taking vitamin D".

## 2020-05-24 ENCOUNTER — Telehealth: Payer: Self-pay

## 2020-05-24 NOTE — Telephone Encounter (Signed)
°  Follow up Call-  Call back number 05/19/2020  Post procedure Call Back phone  # [phone]  Permission to leave phone message Yes  Some recent data might be hidden     Patient questions:  Do you have a fever, pain , or abdominal swelling? No. Pain Score  0 *  Have you tolerated food without any problems? Yes.    Have you been able to return to your normal activities? Yes.    Do you have any questions about your discharge instructions: Diet   No. Medications  No. Follow up visit  No.  Do you have questions or concerns about your Care? No.  Actions: * If pain score is 4 or above: No action needed, pain <4. 1. Have you developed a fever since your procedure? no  2.   Have you had an respiratory symptoms (SOB or cough) since your procedure? no  3.   Have you tested positive for COVID 19 since your procedure no  4.   Have you had any family members/close contacts diagnosed with the COVID 19 since your procedure?  no   If yes to any of these questions please route to Joylene John, RN and Erenest Rasher, RN

## 2020-05-30 ENCOUNTER — Encounter: Payer: Self-pay | Admitting: Gastroenterology

## 2020-06-06 NOTE — Addendum Note (Signed)
Addended by: Marrion Coy on: 06/06/2020 04:23 PM   Modules accepted: Orders

## 2020-07-04 ENCOUNTER — Other Ambulatory Visit: Payer: Self-pay

## 2020-07-14 ENCOUNTER — Other Ambulatory Visit: Payer: Self-pay

## 2020-07-15 ENCOUNTER — Other Ambulatory Visit (INDEPENDENT_AMBULATORY_CARE_PROVIDER_SITE_OTHER): Payer: 59

## 2020-07-15 DIAGNOSIS — E559 Vitamin D deficiency, unspecified: Secondary | ICD-10-CM

## 2020-07-16 LAB — VITAMIN D 25 HYDROXY (VIT D DEFICIENCY, FRACTURES): Vit D, 25-Hydroxy: 59 ng/mL (ref 30–100)

## 2020-07-20 ENCOUNTER — Other Ambulatory Visit: Payer: Self-pay | Admitting: Internal Medicine

## 2020-07-20 DIAGNOSIS — E559 Vitamin D deficiency, unspecified: Secondary | ICD-10-CM

## 2021-04-11 ENCOUNTER — Encounter: Payer: 59 | Admitting: Internal Medicine

## 2021-04-14 ENCOUNTER — Encounter: Payer: 59 | Admitting: Internal Medicine

## 2021-04-18 ENCOUNTER — Other Ambulatory Visit: Payer: Self-pay

## 2021-04-18 ENCOUNTER — Encounter: Payer: Self-pay | Admitting: Internal Medicine

## 2021-04-18 ENCOUNTER — Ambulatory Visit (INDEPENDENT_AMBULATORY_CARE_PROVIDER_SITE_OTHER): Payer: 59 | Admitting: Internal Medicine

## 2021-04-18 ENCOUNTER — Other Ambulatory Visit: Payer: Self-pay | Admitting: Internal Medicine

## 2021-04-18 VITALS — BP 110/70 | HR 73 | Temp 98.1°F | Ht 72.0 in | Wt 188.6 lb

## 2021-04-18 DIAGNOSIS — Z0000 Encounter for general adult medical examination without abnormal findings: Secondary | ICD-10-CM | POA: Diagnosis not present

## 2021-04-18 DIAGNOSIS — C61 Malignant neoplasm of prostate: Secondary | ICD-10-CM | POA: Diagnosis not present

## 2021-04-18 DIAGNOSIS — E785 Hyperlipidemia, unspecified: Secondary | ICD-10-CM

## 2021-04-18 DIAGNOSIS — E559 Vitamin D deficiency, unspecified: Secondary | ICD-10-CM

## 2021-04-18 DIAGNOSIS — R7302 Impaired glucose tolerance (oral): Secondary | ICD-10-CM

## 2021-04-18 DIAGNOSIS — R140 Abdominal distension (gaseous): Secondary | ICD-10-CM

## 2021-04-18 LAB — COMPREHENSIVE METABOLIC PANEL
ALT: 14 U/L (ref 0–53)
AST: 17 U/L (ref 0–37)
Albumin: 4.4 g/dL (ref 3.5–5.2)
Alkaline Phosphatase: 84 U/L (ref 39–117)
BUN: 14 mg/dL (ref 6–23)
CO2: 24 mEq/L (ref 19–32)
Calcium: 9.6 mg/dL (ref 8.4–10.5)
Chloride: 105 mEq/L (ref 96–112)
Creatinine, Ser: 1.09 mg/dL (ref 0.40–1.50)
GFR: 71.77 mL/min (ref >60.00)
Glucose, Bld: 76 mg/dL (ref 70–99)
Potassium: 4 mEq/L (ref 3.5–5.1)
Sodium: 138 mEq/L (ref 135–145)
Total Bilirubin: 0.3 mg/dL (ref 0.2–1.2)
Total Protein: 7.2 g/dL (ref 6.0–8.3)

## 2021-04-18 LAB — CBC WITH DIFFERENTIAL/PLATELET
Basophils Absolute: 0 10*3/uL (ref 0.0–0.1)
Basophils Relative: 0.7 % (ref 0.0–3.0)
Eosinophils Absolute: 0.1 10*3/uL (ref 0.0–0.7)
Eosinophils Relative: 3 % (ref 0.0–5.0)
HCT: 38.9 % — ABNORMAL LOW (ref 39.0–52.0)
Hemoglobin: 13.3 g/dL (ref 13.0–17.0)
Lymphocytes Relative: 51.8 % — ABNORMAL HIGH (ref 12.0–46.0)
Lymphs Abs: 2.2 10*3/uL (ref 0.7–4.0)
MCHC: 34.3 g/dL (ref 30.0–36.0)
MCV: 95.3 fl (ref 78.0–100.0)
Monocytes Absolute: 0.5 10*3/uL (ref 0.1–1.0)
Monocytes Relative: 10.4 % (ref 3.0–12.0)
Neutro Abs: 1.5 10*3/uL (ref 1.4–7.7)
Neutrophils Relative %: 34.1 % — ABNORMAL LOW (ref 43.0–77.0)
Platelets: 228 10*3/uL (ref 150.0–400.0)
RBC: 4.08 Mil/uL — ABNORMAL LOW (ref 4.22–5.81)
RDW: 13.1 % (ref 11.5–15.5)
WBC: 4.3 10*3/uL (ref 4.0–10.5)

## 2021-04-18 LAB — LIPID PANEL
Cholesterol: 201 mg/dL — ABNORMAL HIGH (ref 0–200)
HDL: 63.9 mg/dL (ref >39.00)
LDL Cholesterol: 120 mg/dL — ABNORMAL HIGH (ref 0–99)
NonHDL: 137.11
Total CHOL/HDL Ratio: 3
Triglycerides: 87 mg/dL (ref 0.0–149.0)
VLDL: 17.4 mg/dL (ref 0.0–40.0)

## 2021-04-18 LAB — VITAMIN B12: Vitamin B-12: 643 pg/mL (ref 211–911)

## 2021-04-18 LAB — HEMOGLOBIN A1C: Hgb A1c MFr Bld: 6 % (ref 4.6–6.5)

## 2021-04-18 LAB — TSH: TSH: 1.45 u[IU]/mL (ref 0.35–4.50)

## 2021-04-18 LAB — PSA: PSA: 0.01 ng/mL — ABNORMAL LOW (ref 0.10–4.00)

## 2021-04-18 LAB — VITAMIN D 25 HYDROXY (VIT D DEFICIENCY, FRACTURES): VITD: 20.03 ng/mL — ABNORMAL LOW (ref 30.00–100.00)

## 2021-04-18 MED ORDER — SACCHAROMYCES BOULARDII 250 MG PO CAPS: 250.0000 mg | ORAL_CAPSULE | Freq: Two times a day (BID) | ORAL | 1 refills | Status: DC

## 2021-04-18 MED ORDER — VITAMIN D (ERGOCALCIFEROL) 1.25 MG (50000 UNIT) PO CAPS
50000.0000 [IU] | ORAL_CAPSULE | ORAL | 0 refills | Status: AC
Start: 2021-04-18 — End: 2021-07-05

## 2021-04-18 NOTE — Addendum Note (Signed)
Addended by: Amanda Cockayne on: 04/18/2021 09:13 AM   Modules accepted: Orders
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
## 2021-04-18 NOTE — Patient Instructions (Addendum)
-Nice seeing you today!!  -Lab work today; will notify you once results are available.  -Consider updating your COVID and shingles vaccines.  -Start Florastor 250 mg 1 capsule twice daily.  -Schedule follow up in 1 year or sooner as needed.   Preventive Care 23-65 Years Old, Male Preventive care refers to lifestyle choices and visits with your health care provider that can promote health and wellness. This includes:  A yearly physical exam. This is also called an annual wellness visit.  Regular dental and eye exams.  Immunizations.  Screening for certain conditions.  Healthy lifestyle choices, such as: ? Eating a healthy diet. ? Getting regular exercise. ? Not using drugs or products that contain nicotine and tobacco. ? Limiting alcohol use. What can I expect for my preventive care visit? Physical exam Your health care provider will check your:  Height and weight. These may be used to calculate your BMI (body mass index). BMI is a measurement that tells if you are at a healthy weight.  Heart rate and blood pressure.  Body temperature.  Skin for abnormal spots. Counseling Your health care provider may ask you questions about your:  Past medical problems.  Family's medical history.  Alcohol, tobacco, and drug use.  Emotional well-being.  Home life and relationship well-being.  Sexual activity.  Diet, exercise, and sleep habits.  Work and work Statistician.  Access to firearms. What immunizations do I need? Vaccines are usually given at various ages, according to a schedule. Your health care provider will recommend vaccines for you based on your age, medical history, and lifestyle or other factors, such as travel or where you work.   What tests do I need? Blood tests  Lipid and cholesterol levels. These may be checked every 5 years, or more often if you are over 74 years old.  Hepatitis C test.  Hepatitis B test. Screening  Lung cancer screening. You  may have this screening every year starting at age 32 if you have a 30-pack-year history of smoking and currently smoke or have quit within the past 15 years.  Prostate cancer screening. Recommendations will vary depending on your family history and other risks.  Genital exam to check for testicular cancer or hernias.  Colorectal cancer screening. ? All adults should have this screening starting at age 34 and continuing until age 8. ? Your health care provider may recommend screening at age 69 if you are at increased risk. ? You will have tests every 1-10 years, depending on your results and the type of screening test.  Diabetes screening. ? This is done by checking your blood sugar (glucose) after you have not eaten for a while (fasting). ? You may have this done every 1-3 years.  STD (sexually transmitted disease) testing, if you are at risk. Follow these instructions at home: Eating and drinking  Eat a diet that includes fresh fruits and vegetables, whole grains, lean protein, and low-fat dairy products.  Take vitamin and mineral supplements as recommended by your health care provider.  Do not drink alcohol if your health care provider tells you not to drink.  If you drink alcohol: ? Limit how much you have to 0-2 drinks a day. ? Be aware of how much alcohol is in your drink. In the [place] one drink equals one 12 oz bottle of beer (355 mL), one 5 oz glass of wine (148 mL), or one 1 oz glass of hard liquor (44 mL).   Lifestyle  Take daily care  of your teeth and gums. Brush your teeth every morning and night with fluoride toothpaste. Floss one time each day.  Stay active. Exercise for at least 30 minutes 5 or more days each week.  Do not use any products that contain nicotine or tobacco, such as cigarettes, e-cigarettes, and chewing tobacco. If you need help quitting, ask your health care provider.  Do not use drugs.  If you are sexually active, practice safe sex. Use a condom  or other form of protection to prevent STIs (sexually transmitted infections).  If told by your health care provider, take low-dose aspirin daily starting at age 76.  Find healthy ways to cope with stress, such as: ? Meditation, yoga, or listening to music. ? Journaling. ? Talking to a trusted person. ? Spending time with friends and family. Safety  Always wear your seat belt while driving or riding in a vehicle.  Do not drive: ? If you have been drinking alcohol. Do not ride with someone who has been drinking. ? When you are tired or distracted. ? While texting.  Wear a helmet and other protective equipment during sports activities.  If you have firearms in your house, make sure you follow all gun safety procedures. What's next?  Go to your health care provider once a year for an annual wellness visit.  Ask your health care provider how often you should have your eyes and teeth checked.  Stay up to date on all vaccines. This information is not intended to replace advice given to you by your health care provider. Make sure you discuss any questions you have with your health care provider. Document Revised: 08/04/2019 Document Reviewed: 10/30/2018 Elsevier Patient Education  2021 [employer].

## 2021-04-19 ENCOUNTER — Ambulatory Visit: Payer: Self-pay | Admitting: Internal Medicine

## 2021-07-03 ENCOUNTER — Other Ambulatory Visit: Payer: Self-pay | Admitting: Internal Medicine

## 2021-07-03 DIAGNOSIS — E559 Vitamin D deficiency, unspecified: Secondary | ICD-10-CM

## 2021-07-17 ENCOUNTER — Other Ambulatory Visit: Payer: Self-pay

## 2021-07-18 ENCOUNTER — Other Ambulatory Visit (INDEPENDENT_AMBULATORY_CARE_PROVIDER_SITE_OTHER): Payer: 59

## 2021-07-18 ENCOUNTER — Other Ambulatory Visit: Payer: Self-pay | Admitting: Internal Medicine

## 2021-07-18 DIAGNOSIS — E559 Vitamin D deficiency, unspecified: Secondary | ICD-10-CM

## 2021-07-18 DIAGNOSIS — R7302 Impaired glucose tolerance (oral): Secondary | ICD-10-CM

## 2021-07-18 LAB — HEMOGLOBIN A1C: Hgb A1c MFr Bld: 6 % (ref 4.6–6.5)

## 2021-07-18 LAB — VITAMIN D 25 HYDROXY (VIT D DEFICIENCY, FRACTURES): VITD: 30.04 ng/mL (ref 30.00–100.00)

## 2021-07-18 MED ORDER — VITAMIN D (ERGOCALCIFEROL) 1.25 MG (50000 UNIT) PO CAPS: 50000.0000 [IU] | ORAL_CAPSULE | ORAL | 0 refills | Status: AC

## 2021-07-19 ENCOUNTER — Telehealth: Payer: Self-pay

## 2021-07-19 NOTE — Telephone Encounter (Signed)
Patient is aware 

## 2021-07-19 NOTE — Telephone Encounter (Signed)
Patient returned call and was informed of results verbalized understanding and will pick up Rx from pharnacy. Patient wanted to know if there is a stronger dose of the probiotic or prescription that can be taken pt stated he would like to get rid of the bacteria quickly.

## 2021-10-10 ENCOUNTER — Other Ambulatory Visit: Payer: Self-pay | Admitting: Internal Medicine

## 2021-10-10 DIAGNOSIS — E559 Vitamin D deficiency, unspecified: Secondary | ICD-10-CM

## 2021-10-17 ENCOUNTER — Other Ambulatory Visit (INDEPENDENT_AMBULATORY_CARE_PROVIDER_SITE_OTHER): Payer: 59

## 2021-10-17 ENCOUNTER — Other Ambulatory Visit: Payer: Self-pay | Admitting: Internal Medicine

## 2021-10-17 DIAGNOSIS — E559 Vitamin D deficiency, unspecified: Secondary | ICD-10-CM | POA: Diagnosis not present

## 2021-10-17 LAB — VITAMIN D 25 HYDROXY (VIT D DEFICIENCY, FRACTURES): VITD: 28.98 ng/mL — ABNORMAL LOW (ref 30.00–100.00)

## 2021-10-17 MED ORDER — VITAMIN D (ERGOCALCIFEROL) 1.25 MG (50000 UNIT) PO CAPS
50000.0000 [IU] | ORAL_CAPSULE | ORAL | 0 refills | Status: AC
Start: 2021-10-17 — End: 2022-01-03

## 2021-10-18 ENCOUNTER — Other Ambulatory Visit: Payer: Self-pay | Admitting: Internal Medicine

## 2021-10-18 ENCOUNTER — Other Ambulatory Visit: Payer: Self-pay

## 2021-10-18 DIAGNOSIS — E559 Vitamin D deficiency, unspecified: Secondary | ICD-10-CM

## 2022-01-23 ENCOUNTER — Other Ambulatory Visit (INDEPENDENT_AMBULATORY_CARE_PROVIDER_SITE_OTHER): Payer: Medicare Other

## 2022-01-23 DIAGNOSIS — E559 Vitamin D deficiency, unspecified: Secondary | ICD-10-CM

## 2022-01-23 LAB — VITAMIN D 25 HYDROXY (VIT D DEFICIENCY, FRACTURES): VITD: 38.28 ng/mL (ref 30.00–100.00)

## 2022-04-25 ENCOUNTER — Encounter: Payer: Self-pay | Admitting: Internal Medicine

## 2022-04-25 ENCOUNTER — Other Ambulatory Visit: Payer: Self-pay | Admitting: Internal Medicine

## 2022-04-25 ENCOUNTER — Other Ambulatory Visit: Payer: Self-pay | Admitting: *Deleted

## 2022-04-25 ENCOUNTER — Ambulatory Visit (INDEPENDENT_AMBULATORY_CARE_PROVIDER_SITE_OTHER): Payer: Medicare Other | Admitting: Internal Medicine

## 2022-04-25 VITALS — BP 118/80 | HR 79 | Temp 98.1°F | Ht 71.75 in | Wt 180.2 lb

## 2022-04-25 DIAGNOSIS — Z0000 Encounter for general adult medical examination without abnormal findings: Secondary | ICD-10-CM | POA: Diagnosis not present

## 2022-04-25 DIAGNOSIS — C61 Malignant neoplasm of prostate: Secondary | ICD-10-CM | POA: Diagnosis not present

## 2022-04-25 DIAGNOSIS — R7302 Impaired glucose tolerance (oral): Secondary | ICD-10-CM

## 2022-04-25 DIAGNOSIS — F17211 Nicotine dependence, cigarettes, in remission: Secondary | ICD-10-CM

## 2022-04-25 DIAGNOSIS — E785 Hyperlipidemia, unspecified: Secondary | ICD-10-CM

## 2022-04-25 DIAGNOSIS — E559 Vitamin D deficiency, unspecified: Secondary | ICD-10-CM

## 2022-04-25 DIAGNOSIS — N5231 Erectile dysfunction following radical prostatectomy: Secondary | ICD-10-CM

## 2022-04-25 DIAGNOSIS — R143 Flatulence: Secondary | ICD-10-CM

## 2022-04-25 LAB — CBC WITH DIFFERENTIAL/PLATELET
Basophils Absolute: 0 10*3/uL (ref 0.0–0.1)
Basophils Relative: 0.9 % (ref 0.0–3.0)
Eosinophils Absolute: 0.1 10*3/uL (ref 0.0–0.7)
Eosinophils Relative: 3.2 % (ref 0.0–5.0)
HCT: 39.5 % (ref 39.0–52.0)
Hemoglobin: 13.3 g/dL (ref 13.0–17.0)
Lymphocytes Relative: 42.7 % (ref 12.0–46.0)
Lymphs Abs: 1.6 10*3/uL (ref 0.7–4.0)
MCHC: 33.7 g/dL (ref 30.0–36.0)
MCV: 95 fl (ref 78.0–100.0)
Monocytes Absolute: 0.5 10*3/uL (ref 0.1–1.0)
Monocytes Relative: 12.6 % — ABNORMAL HIGH (ref 3.0–12.0)
Neutro Abs: 1.6 10*3/uL (ref 1.4–7.7)
Neutrophils Relative %: 40.6 % — ABNORMAL LOW (ref 43.0–77.0)
Platelets: 225 10*3/uL (ref 150.0–400.0)
RBC: 4.16 Mil/uL — ABNORMAL LOW (ref 4.22–5.81)
RDW: 13.1 % (ref 11.5–15.5)
WBC: 3.8 10*3/uL — ABNORMAL LOW (ref 4.0–10.5)

## 2022-04-25 LAB — COMPREHENSIVE METABOLIC PANEL
ALT: 15 U/L (ref 0–53)
AST: 23 U/L (ref 0–37)
Albumin: 4.4 g/dL (ref 3.5–5.2)
Alkaline Phosphatase: 72 U/L (ref 39–117)
BUN: 16 mg/dL (ref 6–23)
CO2: 29 mEq/L (ref 19–32)
Calcium: 9.7 mg/dL (ref 8.4–10.5)
Chloride: 102 mEq/L (ref 96–112)
Creatinine, Ser: 1.11 mg/dL (ref 0.40–1.50)
GFR: 69.72 mL/min (ref >60.00)
Glucose, Bld: 95 mg/dL (ref 70–99)
Potassium: 3.7 mEq/L (ref 3.5–5.1)
Sodium: 139 mEq/L (ref 135–145)
Total Bilirubin: 0.7 mg/dL (ref 0.2–1.2)
Total Protein: 7.2 g/dL (ref 6.0–8.3)

## 2022-04-25 LAB — LIPID PANEL
Cholesterol: 224 mg/dL — ABNORMAL HIGH (ref 0–200)
HDL: 68.7 mg/dL (ref >39.00)
LDL Cholesterol: 141 mg/dL — ABNORMAL HIGH (ref 0–99)
NonHDL: 155.01
Total CHOL/HDL Ratio: 3
Triglycerides: 71 mg/dL (ref 0.0–149.0)
VLDL: 14.2 mg/dL (ref 0.0–40.0)

## 2022-04-25 LAB — HEMOGLOBIN A1C: Hgb A1c MFr Bld: 6.1 % (ref 4.6–6.5)

## 2022-04-25 LAB — PSA: PSA: 0.01 ng/mL — ABNORMAL LOW (ref 0.10–4.00)

## 2022-04-25 LAB — VITAMIN D 25 HYDROXY (VIT D DEFICIENCY, FRACTURES): VITD: 36.7 ng/mL (ref 30.00–100.00)

## 2022-04-25 MED ORDER — SUPER QUAD-MIX 150-20-0.2-2 MG IC SOLR: INTRACAVERNOUS | 3 refills | Status: DC
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
## 2022-04-25 NOTE — Patient Instructions (Signed)
-  Nice seeing you today!!  -Lab work today; will notify you once results are available.  -See you back in 1 year or sooner as needed.

## 2022-08-09 ENCOUNTER — Encounter: Payer: Self-pay | Admitting: Nurse Practitioner

## 2022-08-09 ENCOUNTER — Other Ambulatory Visit: Payer: Medicare Other

## 2022-08-09 ENCOUNTER — Ambulatory Visit (INDEPENDENT_AMBULATORY_CARE_PROVIDER_SITE_OTHER): Payer: Medicare Other | Admitting: Nurse Practitioner

## 2022-08-09 VITALS — BP 114/72 | HR 72 | Ht 71.25 in | Wt 186.4 lb

## 2022-08-09 DIAGNOSIS — R143 Flatulence: Secondary | ICD-10-CM

## 2022-08-09 DIAGNOSIS — R140 Abdominal distension (gaseous): Secondary | ICD-10-CM

## 2022-08-09 MED ORDER — METRONIDAZOLE 250 MG PO TABS: 250.0000 mg | ORAL_TABLET | Freq: Four times a day (QID) | ORAL | 0 refills | Status: AC

## 2022-08-09 NOTE — Patient Instructions (Addendum)
_______________________________________________________  If you are age 66 or older, your body mass index should be between 23-30. Your Body mass index is 25.81 kg/m. If this is out of the aforementioned range listed, please consider follow up with your Primary Care Provider. ________________________________________________________  The [facility] providers would like to encourage you to use [facility] to communicate with providers for non-urgent requests or questions.  Due to long hold times on the telephone, sending your provider a message by [facility] may be a faster and more efficient way to get a response.  Please allow 48 business hours for a response.  Please remember that this is for non-urgent requests.  _______________________________________________________  Your provider has requested that you go to the basement level for lab work before leaving today. Press "B" on the elevator. The lab is located at the first door on the left as you exit the elevator.  Due to recent changes in healthcare laws, you may see the results of your imaging and laboratory studies on MyChart before your provider has had a chance to review them.  We understand that in some cases there may be results that are confusing or concerning to you. Not all laboratory results come back in the same time frame and the provider may be waiting for multiple results in order to interpret others.  Please give [place] 48 hours in order for your provider to thoroughly review all the results before contacting the office for clarification of your results.   We have sent the following medications to your pharmacy for you to pick up at your convenience:  Flagyl 250 one tablet 4 times daily for 10 days.  You are scheduled to follow up on 09-11-22 at 1:30pm.  Thank you for entrusting me with your care and choosing [facility].  Tye Savoy, NP
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
## 2022-08-10 LAB — IGA: Immunoglobulin A: 287 mg/dL (ref 70–320)

## 2022-08-10 LAB — TISSUE TRANSGLUTAMINASE, IGA: (tTG) Ab, IgA: <1 U/mL

## 2022-08-21 ENCOUNTER — Telehealth: Payer: Self-pay | Admitting: Nurse Practitioner

## 2022-08-21 NOTE — Telephone Encounter (Signed)
Inbound call from patient returning call. Please give a call back to further advise.  Thank you 

## 2022-08-21 NOTE — Telephone Encounter (Signed)
Spoke with pt. See 9/21 lab result note.

## 2022-09-11 ENCOUNTER — Ambulatory Visit (INDEPENDENT_AMBULATORY_CARE_PROVIDER_SITE_OTHER): Payer: Medicare Other | Admitting: Nurse Practitioner

## 2022-09-11 ENCOUNTER — Encounter: Payer: Self-pay | Admitting: Nurse Practitioner

## 2022-09-11 VITALS — BP 118/68 | HR 74 | Ht 71.0 in | Wt 183.0 lb

## 2022-09-11 DIAGNOSIS — R140 Abdominal distension (gaseous): Secondary | ICD-10-CM
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
## 2022-10-25 ENCOUNTER — Ambulatory Visit (INDEPENDENT_AMBULATORY_CARE_PROVIDER_SITE_OTHER): Payer: Medicare Other | Admitting: Internal Medicine

## 2022-10-25 ENCOUNTER — Encounter: Payer: Self-pay | Admitting: Internal Medicine

## 2022-10-25 VITALS — BP 124/84 | HR 76 | Temp 98.9°F | Wt 165.9 lb

## 2022-10-25 DIAGNOSIS — C61 Malignant neoplasm of prostate: Secondary | ICD-10-CM | POA: Diagnosis not present

## 2022-10-25 DIAGNOSIS — R7302 Impaired glucose tolerance (oral): Secondary | ICD-10-CM | POA: Diagnosis not present

## 2022-10-25 DIAGNOSIS — E785 Hyperlipidemia, unspecified: Secondary | ICD-10-CM

## 2022-10-25 DIAGNOSIS — E559 Vitamin D deficiency, unspecified: Secondary | ICD-10-CM

## 2022-10-25 DIAGNOSIS — L309 Dermatitis, unspecified: Secondary | ICD-10-CM

## 2022-10-25 LAB — POCT GLYCOSYLATED HEMOGLOBIN (HGB A1C): Hemoglobin A1C: 5.4 % (ref 4.0–5.6)

## 2022-10-25 MED ORDER — CLOBETASOL PROPIONATE 0.05 % EX OINT: 1.0000 | TOPICAL_OINTMENT | Freq: Two times a day (BID) | CUTANEOUS | 2 refills | Status: AC
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
## 2023-04-29 ENCOUNTER — Ambulatory Visit (INDEPENDENT_AMBULATORY_CARE_PROVIDER_SITE_OTHER): Payer: Medicare Other | Admitting: Internal Medicine

## 2023-04-29 ENCOUNTER — Encounter: Payer: Self-pay | Admitting: Internal Medicine

## 2023-04-29 VITALS — BP 110/78 | HR 80 | Temp 97.7°F | Ht 72.0 in | Wt 170.7 lb

## 2023-04-29 DIAGNOSIS — Z1159 Encounter for screening for other viral diseases: Secondary | ICD-10-CM

## 2023-04-29 DIAGNOSIS — Z0000 Encounter for general adult medical examination without abnormal findings: Secondary | ICD-10-CM

## 2023-04-29 DIAGNOSIS — E559 Vitamin D deficiency, unspecified: Secondary | ICD-10-CM

## 2023-04-29 DIAGNOSIS — R7302 Impaired glucose tolerance (oral): Secondary | ICD-10-CM | POA: Diagnosis not present

## 2023-04-29 DIAGNOSIS — E785 Hyperlipidemia, unspecified: Secondary | ICD-10-CM

## 2023-04-29 DIAGNOSIS — Z136 Encounter for screening for cardiovascular disorders: Secondary | ICD-10-CM

## 2023-04-29 DIAGNOSIS — C61 Malignant neoplasm of prostate: Secondary | ICD-10-CM | POA: Diagnosis not present

## 2023-04-29 LAB — COMPREHENSIVE METABOLIC PANEL
ALT: 14 U/L (ref 0–53)
AST: 19 U/L (ref 0–37)
Albumin: 4.5 g/dL (ref 3.5–5.2)
Alkaline Phosphatase: 59 U/L (ref 39–117)
BUN: 22 mg/dL (ref 6–23)
CO2: 29 mEq/L (ref 19–32)
Calcium: 10 mg/dL (ref 8.4–10.5)
Chloride: 102 mEq/L (ref 96–112)
Creatinine, Ser: 1.4 mg/dL (ref 0.40–1.50)
GFR: 52.4 mL/min — ABNORMAL LOW (ref >60.00)
Glucose, Bld: 88 mg/dL (ref 70–99)
Potassium: 4.2 mEq/L (ref 3.5–5.1)
Sodium: 140 mEq/L (ref 135–145)
Total Bilirubin: 0.5 mg/dL (ref 0.2–1.2)
Total Protein: 7.5 g/dL (ref 6.0–8.3)

## 2023-04-29 LAB — LIPID PANEL
Cholesterol: 225 mg/dL — ABNORMAL HIGH (ref 0–200)
HDL: 83.6 mg/dL (ref >39.00)
LDL Cholesterol: 127 mg/dL — ABNORMAL HIGH (ref 0–99)
NonHDL: 141.34
Total CHOL/HDL Ratio: 3
Triglycerides: 74 mg/dL (ref 0.0–149.0)
VLDL: 14.8 mg/dL (ref 0.0–40.0)

## 2023-04-29 LAB — CBC WITH DIFFERENTIAL/PLATELET
Basophils Absolute: 0 10*3/uL (ref 0.0–0.1)
Basophils Relative: 0.8 % (ref 0.0–3.0)
Eosinophils Absolute: 0.1 10*3/uL (ref 0.0–0.7)
Eosinophils Relative: 2.5 % (ref 0.0–5.0)
HCT: 41.1 % (ref 39.0–52.0)
Hemoglobin: 13.4 g/dL (ref 13.0–17.0)
Lymphocytes Relative: 51 % — ABNORMAL HIGH (ref 12.0–46.0)
Lymphs Abs: 1.8 10*3/uL (ref 0.7–4.0)
MCHC: 32.6 g/dL (ref 30.0–36.0)
MCV: 97 fl (ref 78.0–100.0)
Monocytes Absolute: 0.4 10*3/uL (ref 0.1–1.0)
Monocytes Relative: 11.5 % (ref 3.0–12.0)
Neutro Abs: 1.2 10*3/uL — ABNORMAL LOW (ref 1.4–7.7)
Neutrophils Relative %: 34.2 % — ABNORMAL LOW (ref 43.0–77.0)
Platelets: 223 10*3/uL (ref 150.0–400.0)
RBC: 4.24 Mil/uL (ref 4.22–5.81)
RDW: 13.4 % (ref 11.5–15.5)
WBC: 3.6 10*3/uL — ABNORMAL LOW (ref 4.0–10.5)

## 2023-04-29 LAB — VITAMIN D 25 HYDROXY (VIT D DEFICIENCY, FRACTURES): VITD: 30.41 ng/mL (ref 30.00–100.00)

## 2023-04-29 LAB — PSA: PSA: 0.02 ng/mL — ABNORMAL LOW (ref 0.10–4.00)

## 2023-04-29 LAB — HEMOGLOBIN A1C: Hgb A1c MFr Bld: 5.9 % (ref 4.6–6.5)

## 2023-04-29 NOTE — Patient Instructions (Signed)
-  Nice seeing you today!!  -Lab work today; will notify you once results are available.  -Remember to update these vaccines: Shingles, RSV, Pneumonia, Flu, COVID.  -See you back in 6 months.
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
## 2023-04-30 LAB — HEPATITIS C ANTIBODY: Hepatitis C Ab: NONREACTIVE

## 2023-05-01 ENCOUNTER — Other Ambulatory Visit: Payer: Self-pay | Admitting: *Deleted

## 2023-05-01 DIAGNOSIS — E785 Hyperlipidemia, unspecified: Secondary | ICD-10-CM

## 2023-06-17 ENCOUNTER — Ambulatory Visit (INDEPENDENT_AMBULATORY_CARE_PROVIDER_SITE_OTHER): Payer: Medicare Other | Admitting: Internal Medicine

## 2023-06-17 VITALS — BP 120/70 | HR 85 | Temp 98.0°F | Wt 167.2 lb

## 2023-06-17 DIAGNOSIS — K4090 Unilateral inguinal hernia, without obstruction or gangrene, not specified as recurrent: Secondary | ICD-10-CM | POA: Diagnosis not present
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
## 2023-07-08 ENCOUNTER — Ambulatory Visit (HOSPITAL_COMMUNITY)
Admission: EM | Admit: 2023-07-08 | Discharge: 2023-07-08 | Disposition: A | Discharge status: Home / Self Care | Payer: Medicare Other | Attending: Nurse Practitioner | Admitting: Nurse Practitioner

## 2023-07-08 ENCOUNTER — Encounter (HOSPITAL_COMMUNITY): Payer: Self-pay

## 2023-07-08 DIAGNOSIS — J029 Acute pharyngitis, unspecified: Secondary | ICD-10-CM

## 2023-07-08 DIAGNOSIS — Z1152 Encounter for screening for COVID-19: Secondary | ICD-10-CM

## 2023-07-08 DIAGNOSIS — J069 Acute upper respiratory infection, unspecified: Secondary | ICD-10-CM

## 2023-07-08 LAB — POCT RAPID STREP A (OFFICE): Rapid Strep A Screen: NEGATIVE

## 2023-07-08 MED ORDER — BENZONATATE 100 MG PO CAPS: 100.0000 mg | ORAL_CAPSULE | Freq: Three times a day (TID) | ORAL | 0 refills | Status: DC | PRN

## 2023-07-08 MED ORDER — LIDOCAINE VISCOUS HCL 2 % MT SOLN
15.0000 mL | OROMUCOSAL | 0 refills | Status: AC | PRN
Start: 2023-07-08 — End: ?
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
## 2023-07-08 NOTE — ED Triage Notes (Signed)
Patient presents with sore throat, excessive mucus, dizziness that comes and goes, body aches that started 2 days ago. Tx with Aleve and severe cold medicine.

## 2023-07-08 NOTE — Discharge Instructions (Addendum)
Rapid strep throat test is negative today.  COVID-19 test is pending as well as a throat culture.  Will contact you with any abnormal results.  You have a viral upper respiratory infection.  Symptoms should improve over the next week to 10 days.  If you develop chest pain or shortness of breath, go to the emergency room.  We have tested you today for COVID-19.  You will see the results in Mychart and we will call you with positive results.  Please stay home and isolate until you are aware of the results.    Some things that can make you feel better are: - Increased rest - Increasing fluid with water/sugar free electrolytes - Acetaminophen and ibuprofen as needed for fever/pain - Salt water gargling, chloraseptic spray, Cepacol throat lozenges, and lidocaine rinses for throat pain - OTC guaifenesin (Mucinex) 600 mg twice daily - Saline sinus flushes or a neti pot - Humidifying the air -Tessalon Perles every 8 hours as needed for dry cough

## 2023-07-09 LAB — SARS CORONAVIRUS 2 (TAT 6-24 HRS): SARS Coronavirus 2: POSITIVE — AB

## 2023-07-11 LAB — CULTURE, GROUP A STREP (THRC)

## 2023-08-20 ENCOUNTER — Ambulatory Visit: Payer: Medicare Other | Admitting: Internal Medicine

## 2023-09-02 ENCOUNTER — Ambulatory Visit: Payer: Medicare Other | Admitting: Internal Medicine

## 2023-09-02 ENCOUNTER — Encounter: Payer: Self-pay | Admitting: Internal Medicine

## 2023-09-02 VITALS — BP 110/70 | HR 65 | Temp 98.3°F | Wt 168.9 lb

## 2023-09-02 DIAGNOSIS — R7302 Impaired glucose tolerance (oral): Secondary | ICD-10-CM

## 2023-09-02 LAB — POCT GLYCOSYLATED HEMOGLOBIN (HGB A1C): Hemoglobin A1C: 5.6 % (ref 4.0–5.6)
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
## 2024-04-30 ENCOUNTER — Ambulatory Visit: Payer: Medicare Other | Admitting: Family Medicine

## 2024-04-30 DIAGNOSIS — Z0000 Encounter for general adult medical examination without abnormal findings: Secondary | ICD-10-CM | POA: Diagnosis not present
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
## 2024-04-30 NOTE — Patient Instructions (Addendum)
 I really enjoyed getting to talk with you today! I am available on Tuesdays and Thursdays for virtual visits if you have any questions or concerns, or if I can be of any further assistance.   CHECKLIST FROM ANNUAL WELLNESS VISIT:  -Follow up (please call to schedule if not scheduled after visit):   -please call to schedule your in office visit   -yearly for annual wellness visit with primary care office  Here is a list of your preventive care/health maintenance measures and the plan for each if any are due:  PLAN For any measures below that may be due:   Please call us  if you decide to do the lung cancer screening.  2.  Can get the vaccines at the pharmacy if you wish. Please let us  know if you do. Health Maintenance  Topic Date Due   COVID-19 Vaccine (1) Never done   Zoster Vaccines- Shingrix (1 of 2) Never done   Lung Cancer Screening  Never done   Pneumococcal Vaccine: 50+ Years (1 of 1 - PCV) Never done   Medicare Annual Wellness (AWV)  04/28/2024   INFLUENZA VACCINE  06/19/2024   DTaP/Tdap/Td (2 - Tdap) 04/06/2026   Colonoscopy  05/20/2027   Hepatitis C Screening  Completed   HPV VACCINES  Aged Out   Meningococcal B Vaccine  Aged Out    -See a dentist at least yearly  -Get your eyes checked and then per your eye specialist's recommendations  -Other issues addressed today:   -I have included below further information regarding a healthy whole foods based diet, physical activity guidelines for adults, stress management and opportunities for social connections. I hope you find this information useful.   -----------------------------------------------------------------------------------------------------------------------------------------------------------------------------------------------------------------------------------------------------------    NUTRITION: -eat real food: lots of colorful vegetables (half the plate) and fruits -5-7 servings of vegetables and  fruits per day (fresh or steamed is best), exp. 2 servings of vegetables with lunch and dinner and 2 servings of fruit per day. Berries and greens such as kale and collards are great choices.  -consume on a regular basis:  fresh fruits, fresh veggies, fish, nuts, seeds, healthy oils (such as olive oil, avocado oil), whole grains (make sure for bread/pasta/crackers/etc., that the first ingredient on label contains the word whole), legumes. -can eat small amounts of dairy and lean meat (no larger than the palm of your hand), but avoid processed meats such as ham, bacon, lunch meat, etc. -drink water -try to avoid fast food and pre-packaged foods, processed meat, ultra processed foods/beverages (donuts, candy, etc.) -most experts advise limiting sodium to < 2300mg  per day, should limit further is any chronic conditions such as high blood pressure, heart disease, diabetes, etc. The American Heart Association advised that < 1500mg  is is ideal -try to avoid foods/beverages that contain any ingredients with names you do not recognize  -try to avoid foods/beverages  with added sugar or sweeteners/sweets  -try to avoid sweet drinks (including diet drinks): soda, juice, Gatorade, sweet tea, power drinks, diet drinks -try to avoid white rice, white bread, pasta (unless whole grain)  EXERCISE GUIDELINES FOR ADULTS: -if you wish to increase your physical activity, do so gradually and with the approval of your doctor -STOP and seek medical care immediately if you have any chest pain, chest discomfort or trouble breathing when starting or increasing exercise  -move and stretch your body, legs, feet and arms when sitting for long periods -Physical activity guidelines for optimal health in adults: -get at least 150 minutes  per week of moderate exercise (can talk, but not sing); this is about 20-30 minutes of sustained activity 5-7 days per week or two 10-15 minute episodes of sustained activity 5-7 days per  week -do some muscle building/resistance training/strength training at least 2 days per week  -balance exercises 3+ days per week:   Stand somewhere where you have something sturdy to hold onto if you lose balance    1) lift up on toes, then back down, start with 5x per day and work up to 20x   2) stand and lift one leg straight out to the side so that foot is a few inches of the floor, start with 5x each side and work up to 20x each side   3) stand on one foot, start with 5 seconds each side and work up to 20 seconds on each side  If you need ideas or help with getting more active:  -Silver sneakers https://tools.silversneakers.com  -Walk with a Doc: http://www.duncan-[email or web address]  -try to include resistance (weight lifting/strength building) and balance exercises twice per week: or the following link for ideas: http://castillo-powell.com/  BuyDucts.dk  STRESS MANAGEMENT: -can try meditating, or just sitting quietly with deep breathing while intentionally relaxing all parts of your body for 5 minutes daily -if you need further help with stress, anxiety or depression please follow up with your primary doctor or contact the wonderful folks at [facility]: [phone]  SOCIAL CONNECTIONS: -options in [place] if you wish to engage in more social and exercise related activities:  -Silver sneakers https://tools.silversneakers.com  -Walk with a Doc: http://www.duncan-[email or web address]  -Check out the [facility] Active Adults 50+ section on the [place] [employer] (hiking clubs, book clubs, cards and games, chess, exercise classes, aquatic classes and much more) - see the website for details: https://www.[place].gov/departments/parks-recreation/active-adults50  -YouTube has lots of exercise videos for different ages and abilities as well  -[facility] (a variety of  indoor and outdoor inperson activities for adults). [phone]. [address]  -Virtual Online Classes (a variety of topics): see seniorplanet.org or call [phone]  -consider volunteering at a school, hospice center, church, senior center or elsewhere   ADVANCED HEALTHCARE DIRECTIVES:  [place] Advanced Directives assistance:   ExpressWeek.com.cy  Everyone should have advanced health care directives in place. This is so that you get the care you want, should you ever be in a situation where you are unable to make your own medical decisions.   From the [place] Advanced Directive Website: Advance Health Care Directives are legal documents in which you give written instructions about your health care if, in the future, you cannot speak for yourself.   A health care power of attorney allows you to name a person you trust to make your health care decisions if you cannot make them yourself. A declaration of a desire for a natural death (or living will) is document, which states that you desire not to have your life prolonged by extraordinary measures if you have a terminal or incurable illness or if you are in a vegetative state. An advance instruction for mental health treatment makes a declaration of instructions, information and preferences regarding your mental health treatment. It also states that you are aware that the advance instruction authorizes a mental health treatment provider to act according to your wishes. It may also outline your consent or refusal of mental health treatment. A declaration of an anatomical gift allows anyone over the age of 43 to make a gift by will, organ  donor card or other document.   Please see the following website or an elder law attorney for forms, FAQs and for completion of advanced directives: [place]  Print production planner Health Care Directives Advance Health Care Directives  (http://guzman.[email or web address]/)  Or copy and paste the following to your web browser: [email or web address]
